# Patient Record
Sex: Female | Born: 1980 | Race: Black or African American | Hispanic: No | Marital: Married | Smoking: Never smoker
Health system: Southern US, Community
[De-identification: ages and names within clinical notes are randomized; demographics above are authoritative.]

## PROBLEM LIST (undated history)

## (undated) DIAGNOSIS — Z789 Other specified health status: Secondary | ICD-10-CM

## (undated) DIAGNOSIS — R87629 Unspecified abnormal cytological findings in specimens from vagina: Secondary | ICD-10-CM

## (undated) HISTORY — PX: WISDOM TOOTH EXTRACTION: SHX21

## (undated) HISTORY — PX: GYNECOLOGIC CRYOSURGERY: SHX857

## (undated) HISTORY — DX: Other specified health status: Z78.9

## (undated) HISTORY — DX: Unspecified abnormal cytological findings in specimens from vagina: R87.629

---

## 2011-04-14 ENCOUNTER — Other Ambulatory Visit (HOSPITAL_COMMUNITY)
Admission: RE | Admit: 2011-04-14 | Discharge: 2011-04-14 | Disposition: A | Discharge status: Home / Self Care | Payer: BC Managed Care – PPO | Source: Ambulatory Visit | Attending: Obstetrics and Gynecology | Admitting: Obstetrics and Gynecology

## 2011-04-14 DIAGNOSIS — Z113 Encounter for screening for infections with a predominantly sexual mode of transmission: Secondary | ICD-10-CM | POA: Insufficient documentation

## 2011-04-14 DIAGNOSIS — Z1159 Encounter for screening for other viral diseases: Secondary | ICD-10-CM | POA: Insufficient documentation

## 2011-04-14 DIAGNOSIS — Z124 Encounter for screening for malignant neoplasm of cervix: Secondary | ICD-10-CM | POA: Insufficient documentation

## 2013-07-23 ENCOUNTER — Other Ambulatory Visit: Payer: Self-pay | Admitting: Family Medicine

## 2013-07-23 DIAGNOSIS — E049 Nontoxic goiter, unspecified: Secondary | ICD-10-CM

## 2013-07-26 ENCOUNTER — Other Ambulatory Visit: Payer: Self-pay

## 2013-07-26 ENCOUNTER — Ambulatory Visit
Admission: RE | Admit: 2013-07-26 | Discharge: 2013-07-26 | Disposition: A | Discharge status: Home / Self Care | Payer: BC Managed Care – PPO | Source: Ambulatory Visit | Attending: Family Medicine | Admitting: Family Medicine

## 2013-07-26 ENCOUNTER — Other Ambulatory Visit: Payer: Self-pay | Admitting: Family Medicine

## 2013-07-26 DIAGNOSIS — E049 Nontoxic goiter, unspecified: Secondary | ICD-10-CM

## 2013-10-28 LAB — OB RESULTS CONSOLE HEPATITIS B SURFACE ANTIGEN: Hepatitis B Surface Ag: NEGATIVE

## 2013-10-28 LAB — OB RESULTS CONSOLE ABO/RH: RH TYPE: POSITIVE

## 2013-10-28 LAB — OB RESULTS CONSOLE RPR: RPR: NONREACTIVE

## 2013-10-28 LAB — OB RESULTS CONSOLE ANTIBODY SCREEN: Antibody Screen: NEGATIVE

## 2013-10-28 LAB — OB RESULTS CONSOLE RUBELLA ANTIBODY, IGM: Rubella: IMMUNE

## 2013-10-28 LAB — OB RESULTS CONSOLE HIV ANTIBODY (ROUTINE TESTING): HIV: NONREACTIVE

## 2013-11-21 LAB — OB RESULTS CONSOLE GC/CHLAMYDIA
Chlamydia: NEGATIVE
Gonorrhea: NEGATIVE

## 2014-03-09 ENCOUNTER — Inpatient Hospital Stay (HOSPITAL_COMMUNITY)
Admission: AD | Admit: 2014-03-09 | Payer: BC Managed Care – PPO | Source: Ambulatory Visit | Admitting: Obstetrics and Gynecology

## 2014-03-12 LAB — OB RESULTS CONSOLE RPR: RPR: NONREACTIVE

## 2014-05-16 LAB — OB RESULTS CONSOLE GBS: STREP GROUP B AG: NEGATIVE

## 2014-06-16 ENCOUNTER — Encounter (HOSPITAL_COMMUNITY): Payer: Self-pay | Admitting: *Deleted

## 2014-06-16 ENCOUNTER — Telehealth (HOSPITAL_COMMUNITY): Payer: Self-pay | Admitting: *Deleted

## 2014-06-16 NOTE — Telephone Encounter (Signed)
Preadmission screen  

## 2014-06-17 ENCOUNTER — Other Ambulatory Visit: Payer: Self-pay | Admitting: Obstetrics and Gynecology

## 2014-06-17 ENCOUNTER — Encounter (HOSPITAL_COMMUNITY): Payer: Self-pay | Admitting: Obstetrics

## 2014-06-17 ENCOUNTER — Inpatient Hospital Stay (HOSPITAL_COMMUNITY)
Admission: AD | Admit: 2014-06-17 | Discharge: 2014-06-20 | DRG: 766 | Disposition: A | Discharge status: Home / Self Care | Payer: BLUE CROSS/BLUE SHIELD | Source: Ambulatory Visit | Attending: Obstetrics and Gynecology | Admitting: Obstetrics and Gynecology

## 2014-06-17 ENCOUNTER — Inpatient Hospital Stay (HOSPITAL_COMMUNITY): Admission: RE | Admit: 2014-06-17 | Payer: BLUE CROSS/BLUE SHIELD | Source: Ambulatory Visit

## 2014-06-17 DIAGNOSIS — O328XX0 Maternal care for other malpresentation of fetus, not applicable or unspecified: Secondary | ICD-10-CM | POA: Diagnosis present

## 2014-06-17 DIAGNOSIS — O480 Post-term pregnancy: Secondary | ICD-10-CM | POA: Diagnosis present

## 2014-06-17 DIAGNOSIS — Z3A41 41 weeks gestation of pregnancy: Secondary | ICD-10-CM | POA: Diagnosis present

## 2014-06-17 LAB — CBC
HCT: 34.7 % — ABNORMAL LOW (ref 36.0–46.0)
Hemoglobin: 11.5 g/dL — ABNORMAL LOW (ref 12.0–15.0)
MCH: 23 pg — ABNORMAL LOW (ref 26.0–34.0)
MCHC: 33.1 g/dL (ref 30.0–36.0)
MCV: 69.5 fL — ABNORMAL LOW (ref 78.0–100.0)
PLATELETS: 171 10*3/uL (ref 150–400)
RBC: 4.99 MIL/uL (ref 3.87–5.11)
RDW: 16.8 % — ABNORMAL HIGH (ref 11.5–15.5)
WBC: 8.3 10*3/uL (ref 4.0–10.5)

## 2014-06-17 LAB — TYPE AND SCREEN
ABO/RH(D): A POS
Antibody Screen: NEGATIVE

## 2014-06-17 LAB — ABO/RH: ABO/RH(D): A POS

## 2014-06-17 MED ORDER — LIDOCAINE HCL (PF) 1 % IJ SOLN: 30.0000 mL | INTRAMUSCULAR | Status: DC | PRN

## 2014-06-17 MED ORDER — OXYTOCIN BOLUS FROM INFUSION: 500.0000 mL | INTRAVENOUS | Status: DC

## 2014-06-17 MED ORDER — LACTATED RINGERS IV SOLN
500.0000 mL | INTRAVENOUS | Status: DC | PRN
  Administered 2014-06-17: 500 mL via INTRAVENOUS

## 2014-06-17 MED ORDER — ZOLPIDEM TARTRATE 5 MG PO TABS: 5.0000 mg | ORAL_TABLET | Freq: Every evening | ORAL | Status: DC | PRN

## 2014-06-17 MED ORDER — MISOPROSTOL 25 MCG QUARTER TABLET
25.0000 ug | ORAL_TABLET | ORAL | Status: DC | PRN
  Administered 2014-06-17 – 2014-06-18 (×2): 25 ug via VAGINAL
  Filled 2014-06-17 (×3): qty 0.25

## 2014-06-17 MED ORDER — ACETAMINOPHEN 325 MG PO TABS: 650.0000 mg | ORAL_TABLET | ORAL | Status: DC | PRN

## 2014-06-17 MED ORDER — TERBUTALINE SULFATE 1 MG/ML IJ SOLN: 0.2500 mg | Freq: Once | INTRAMUSCULAR | Status: AC | PRN

## 2014-06-17 MED ORDER — LACTATED RINGERS IV SOLN
INTRAVENOUS | Status: DC
  Administered 2014-06-17 – 2014-06-18 (×3): via INTRAVENOUS

## 2014-06-17 MED ORDER — CITRIC ACID-SODIUM CITRATE 334-500 MG/5ML PO SOLN
30.0000 mL | ORAL | Status: DC | PRN
  Administered 2014-06-18: 30 mL via ORAL
  Filled 2014-06-17: qty 15

## 2014-06-17 MED ORDER — BUTORPHANOL TARTRATE 1 MG/ML IJ SOLN: 1.0000 mg | INTRAMUSCULAR | Status: DC | PRN

## 2014-06-17 MED ORDER — ONDANSETRON HCL 4 MG/2ML IJ SOLN
4.0000 mg | Freq: Four times a day (QID) | INTRAMUSCULAR | Status: DC | PRN
  Administered 2014-06-18: 4 mg via INTRAVENOUS
  Filled 2014-06-17: qty 2

## 2014-06-17 MED ORDER — OXYTOCIN 40 UNITS IN LACTATED RINGERS INFUSION - SIMPLE MED: 62.5000 mL/h | INTRAVENOUS | Status: DC

## 2014-06-17 MED ORDER — OXYCODONE-ACETAMINOPHEN 5-325 MG PO TABS: 1.0000 | ORAL_TABLET | ORAL | Status: DC | PRN

## 2014-06-17 MED ORDER — OXYTOCIN 40 UNITS IN LACTATED RINGERS INFUSION - SIMPLE MED: 1.0000 m[IU]/min | INTRAVENOUS | Status: DC

## 2014-06-17 MED ORDER — OXYCODONE-ACETAMINOPHEN 5-325 MG PO TABS: 2.0000 | ORAL_TABLET | ORAL | Status: DC | PRN

## 2014-06-17 NOTE — Plan of Care (Signed)
Problem: Consults Goal: Birthing Suites Patient Information Press F2 to bring up selections list Outcome: Completed/Met Date Met:  06/17/14  Inpatient induction

## 2014-06-17 NOTE — H&P (Signed)
Nancy Conley is a 34 y.o. female G1 at 68 6/7 weeks c/w 12 week ultrasound presenting for IOL due to post dates pregnancy.   No complaints today at routine Ob visit.  Pt denies vaginal bleeding, leakage of fluid or contractions.  Active fetus noted.   Pregnancy has been uncomplicated.  Pt has a h/o genital herpes, one outbreak since diagnosis.  None this pregnancy.  Pt has been taking Valtrex 500 mg BID. Maternal Medical History:  Reason for admission: Induction of labor.  Contractions: Frequency: rare.   Perceived severity is mild.    Fetal activity: Perceived fetal activity is normal.    Prenatal complications: no prenatal complications Prenatal Complications - Diabetes: none.    OB History    Gravida Para Term Preterm AB TAB SAB Ectopic Multiple Living   1              Past Medical History  Diagnosis Date  . Medical history non-contributory   . Vaginal Pap smear, abnormal    Past Surgical History  Procedure Laterality Date  . Wisdom tooth extraction    . Gynecologic cryosurgery     Family History: family history includes Arthritis in her maternal uncle; Cancer in her maternal aunt and maternal uncle; Diabetes in her father; Hypertension in her father, maternal grandmother, mother, and paternal grandmother; Kidney disease in her paternal uncle; Stroke in her maternal grandmother. There is no history of Alcohol abuse, Asthma, Birth defects, COPD, Depression, Drug abuse, Hearing loss, Early death, Hyperlipidemia, Heart disease, Mental illness, Learning disabilities, Mental retardation, Miscarriages / Stillbirths, Vision loss, or Varicose Veins. Social History:  reports that she has never smoked. She has never used smokeless tobacco. She reports that she drinks alcohol. She reports that she does not use illicit drugs.   Prenatal Transfer Tool  Maternal Diabetes: No Genetic Screening: Normal Maternal Ultrasounds/Referrals: Normal Fetal Ultrasounds or other Referrals:   None Maternal Substance Abuse:  No Significant Maternal Medications:  None Significant Maternal Lab Results:  Lab values include: Group B Strep negative Other Comments:  H/o genital herpes on Valtrex 500 mg BID  ROS    Last menstrual period 09/10/2013. Maternal Exam:  Abdomen: Patient reports no abdominal tenderness. Estimated fetal weight is 7.5-8 lbs.   Fetal presentation: vertex  Introitus: Normal vulva. Vulva is negative for condylomata and lesion.  Vagina is positive for edema.  Vagina is negative for ulcerations.  Ferning test: not done.   Pelvis: adequate for delivery.   Cervix: Cervix evaluated by sterile speculum exam and digital exam.     Fetal Exam Fetal Monitor Review: Mode: hand-held doppler probe.   Baseline rate: 150s.      Physical Exam  Constitutional: She is oriented to person, place, and time. She appears well-developed and well-nourished. No distress.  HENT:  Head: Normocephalic and atraumatic.  Neck: Normal range of motion.  GI: There is no tenderness.  Genitourinary: Vagina normal and uterus normal. Vulva exhibits no lesion.  Musculoskeletal: Normal range of motion. She exhibits no edema or tenderness.  Neurological: She is alert and oriented to person, place, and time.  Skin: Skin is warm and dry. She is not diaphoretic.  Psychiatric: She has a normal mood and affect.    Prenatal labs: ABO, Rh: A/Positive/-- (06/22 0000) Antibody: Negative (06/22 0000) Rubella:   RPR: Nonreactive (11/04 0000)  HBsAg:    HIV: Non-reactive (06/22 0000)  GBS: Negative (01/08 0000)   Assessment/Plan: IUP at 40 6/7 weeks Postdates pregnancy H/o HSV  2. H/o CKC  Admit for IOL with Cytotec.  Foley in am if cervix is dilated.   Pt counseled on risk of induction of labor.  Informed that due to an unfavorable cervix, risk of cesarean section is higher and induction may be long.  Pt desires to proceed. Continue Valtrex. Stadol prn pain in latent labor. Epidural  in active labor if pt desires. [facility] to cover b/w 7 pm and 7 am.   [name], Christopher Glasscock 06/17/2014, 5:36 PM

## 2014-06-17 NOTE — Progress Notes (Signed)
  Subjective: No c/o.  Objective: BP 124/71 mmHg  Pulse 75  Temp(Src) 98.1 F (36.7 C) (Oral)  Resp 16  Ht 5' 4.75" (1.645 m)  Wt 172 lb (78.019 kg)  BMI 28.83 kg/m2  LMP 09/10/2013     FHT: BL 150 w/ moderate variability, +accels, occ quick/mild variables  2224:2 min late decel x1 to 60 at nadir, with moderate variability after recovery UC:   irregular, every 3-8 minutes SVE: Deferred  Cytotec #1 placed at 8:57 PM   Assessment:  34 yo G1P0 @ 40.6 wks  FHR decel improved w/ IVFs and position change; overall reactive   Plan: Monitor closely Consult as indicated   [name], Laban Orourke CNM 06/17/2014, 10:42 PM

## 2014-06-18 ENCOUNTER — Encounter (HOSPITAL_COMMUNITY)
Admission: AD | Disposition: A | Discharge status: Home / Self Care | Payer: Self-pay | Source: Ambulatory Visit | Attending: Obstetrics and Gynecology

## 2014-06-18 ENCOUNTER — Encounter (HOSPITAL_COMMUNITY): Payer: Self-pay

## 2014-06-18 ENCOUNTER — Inpatient Hospital Stay (HOSPITAL_COMMUNITY): Payer: BLUE CROSS/BLUE SHIELD | Admitting: Anesthesiology

## 2014-06-18 SURGERY — Surgical Case
Anesthesia: Epidural | Site: Abdomen

## 2014-06-18 MED ORDER — SIMETHICONE 80 MG PO CHEW
80.0000 mg | CHEWABLE_TABLET | ORAL | Status: DC
  Administered 2014-06-19 (×2): 80 mg via ORAL
  Filled 2014-06-18 (×4): qty 1

## 2014-06-18 MED ORDER — KETOROLAC TROMETHAMINE 30 MG/ML IJ SOLN: 30.0000 mg | Freq: Four times a day (QID) | INTRAMUSCULAR | Status: AC | PRN

## 2014-06-18 MED ORDER — METHYLERGONOVINE MALEATE 0.2 MG/ML IJ SOLN: 0.2000 mg | INTRAMUSCULAR | Status: DC | PRN

## 2014-06-18 MED ORDER — NALBUPHINE HCL 10 MG/ML IJ SOLN: 5.0000 mg | INTRAMUSCULAR | Status: DC | PRN

## 2014-06-18 MED ORDER — NALBUPHINE HCL 10 MG/ML IJ SOLN
5.0000 mg | INTRAMUSCULAR | Status: DC | PRN
  Administered 2014-06-19: 5 mg via INTRAVENOUS

## 2014-06-18 MED ORDER — EPHEDRINE 5 MG/ML INJ: 10.0000 mg | INTRAVENOUS | Status: DC | PRN

## 2014-06-18 MED ORDER — PROMETHAZINE HCL 25 MG/ML IJ SOLN: 6.2500 mg | INTRAMUSCULAR | Status: DC | PRN

## 2014-06-18 MED ORDER — NALOXONE HCL 1 MG/ML IJ SOLN: 1.0000 ug/kg/h | INTRAVENOUS | Status: DC | PRN

## 2014-06-18 MED ORDER — SCOPOLAMINE 1 MG/3DAYS TD PT72
MEDICATED_PATCH | TRANSDERMAL | Status: DC | PRN
  Administered 2014-06-18: 1 via TRANSDERMAL

## 2014-06-18 MED ORDER — FENTANYL 2.5 MCG/ML BUPIVACAINE 1/10 % EPIDURAL INFUSION (WH - ANES)
14.0000 mL/h | INTRAMUSCULAR | Status: DC | PRN
  Administered 2014-06-18: 14 mL/h via EPIDURAL
  Filled 2014-06-18: qty 125

## 2014-06-18 MED ORDER — DIPHENHYDRAMINE HCL 50 MG/ML IJ SOLN: 12.5000 mg | INTRAMUSCULAR | Status: DC | PRN

## 2014-06-18 MED ORDER — LACTATED RINGERS IV SOLN
INTRAVENOUS | Status: DC
  Administered 2014-06-18: 16:00:00 via INTRAUTERINE

## 2014-06-18 MED ORDER — CEFAZOLIN SODIUM-DEXTROSE 2-3 GM-% IV SOLR: 2.0000 g | INTRAVENOUS | Status: DC

## 2014-06-18 MED ORDER — CEFAZOLIN SODIUM-DEXTROSE 2-3 GM-% IV SOLR
INTRAVENOUS | Status: DC | PRN
  Administered 2014-06-18: 2 g via INTRAVENOUS

## 2014-06-18 MED ORDER — MORPHINE SULFATE (PF) 0.5 MG/ML IJ SOLN
INTRAMUSCULAR | Status: DC | PRN
  Administered 2014-06-18: 1 mg via INTRAVENOUS
  Administered 2014-06-18: 4 mg via EPIDURAL

## 2014-06-18 MED ORDER — OXYCODONE-ACETAMINOPHEN 5-325 MG PO TABS: 2.0000 | ORAL_TABLET | ORAL | Status: DC | PRN

## 2014-06-18 MED ORDER — PRENATAL MULTIVITAMIN CH
1.0000 | ORAL_TABLET | Freq: Every day | ORAL | Status: DC
  Administered 2014-06-19 – 2014-06-20 (×2): 1 via ORAL
  Filled 2014-06-18 (×2): qty 1

## 2014-06-18 MED ORDER — ONDANSETRON HCL 4 MG/2ML IJ SOLN: 4.0000 mg | Freq: Three times a day (TID) | INTRAMUSCULAR | Status: DC | PRN

## 2014-06-18 MED ORDER — PHENYLEPHRINE 40 MCG/ML (10ML) SYRINGE FOR IV PUSH (FOR BLOOD PRESSURE SUPPORT)
80.0000 ug | PREFILLED_SYRINGE | INTRAVENOUS | Status: DC | PRN
  Filled 2014-06-18: qty 20

## 2014-06-18 MED ORDER — MEPERIDINE HCL 25 MG/ML IJ SOLN: 6.2500 mg | INTRAMUSCULAR | Status: DC | PRN

## 2014-06-18 MED ORDER — SIMETHICONE 80 MG PO CHEW: 80.0000 mg | CHEWABLE_TABLET | ORAL | Status: DC | PRN

## 2014-06-18 MED ORDER — LIDOCAINE HCL (PF) 1 % IJ SOLN
INTRAMUSCULAR | Status: DC | PRN
  Administered 2014-06-18 (×2): 5 mL

## 2014-06-18 MED ORDER — SENNOSIDES-DOCUSATE SODIUM 8.6-50 MG PO TABS
2.0000 | ORAL_TABLET | ORAL | Status: DC
  Administered 2014-06-19 (×2): 2 via ORAL
  Filled 2014-06-18 (×4): qty 2

## 2014-06-18 MED ORDER — CEFAZOLIN SODIUM-DEXTROSE 2-3 GM-% IV SOLR
INTRAVENOUS | Status: AC
  Filled 2014-06-18: qty 50

## 2014-06-18 MED ORDER — KETOROLAC TROMETHAMINE 30 MG/ML IJ SOLN
INTRAMUSCULAR | Status: AC
Start: 2014-06-18 — End: 2014-06-19
  Filled 2014-06-18: qty 1

## 2014-06-18 MED ORDER — DIPHENHYDRAMINE HCL 25 MG PO CAPS
25.0000 mg | ORAL_CAPSULE | Freq: Four times a day (QID) | ORAL | Status: DC | PRN
  Filled 2014-06-18: qty 1

## 2014-06-18 MED ORDER — LIDOCAINE-EPINEPHRINE (PF) 2 %-1:200000 IJ SOLN
INTRAMUSCULAR | Status: DC | PRN
  Administered 2014-06-18 (×2): 5 mL via EPIDURAL

## 2014-06-18 MED ORDER — ONDANSETRON HCL 4 MG/2ML IJ SOLN: 4.0000 mg | INTRAMUSCULAR | Status: DC | PRN

## 2014-06-18 MED ORDER — ONDANSETRON HCL 4 MG/2ML IJ SOLN
INTRAMUSCULAR | Status: AC
  Filled 2014-06-18: qty 2

## 2014-06-18 MED ORDER — MENTHOL 3 MG MT LOZG: 1.0000 | LOZENGE | OROMUCOSAL | Status: DC | PRN

## 2014-06-18 MED ORDER — DIPHENHYDRAMINE HCL 25 MG PO CAPS: 25.0000 mg | ORAL_CAPSULE | ORAL | Status: DC | PRN

## 2014-06-18 MED ORDER — 0.9 % SODIUM CHLORIDE (POUR BTL) OPTIME
TOPICAL | Status: DC | PRN
  Administered 2014-06-18: 1000 mL

## 2014-06-18 MED ORDER — METHYLERGONOVINE MALEATE 0.2 MG PO TABS: 0.2000 mg | ORAL_TABLET | ORAL | Status: DC | PRN

## 2014-06-18 MED ORDER — HYDROMORPHONE HCL 1 MG/ML IJ SOLN: 0.2500 mg | INTRAMUSCULAR | Status: DC | PRN

## 2014-06-18 MED ORDER — NALBUPHINE HCL 10 MG/ML IJ SOLN: 5.0000 mg | Freq: Once | INTRAMUSCULAR | Status: AC | PRN

## 2014-06-18 MED ORDER — SODIUM BICARBONATE 8.4 % IV SOLN
INTRAVENOUS | Status: AC
  Filled 2014-06-18: qty 50

## 2014-06-18 MED ORDER — SCOPOLAMINE 1 MG/3DAYS TD PT72
MEDICATED_PATCH | TRANSDERMAL | Status: AC
  Filled 2014-06-18: qty 1

## 2014-06-18 MED ORDER — KETOROLAC TROMETHAMINE 30 MG/ML IJ SOLN
30.0000 mg | Freq: Four times a day (QID) | INTRAMUSCULAR | Status: AC | PRN
  Administered 2014-06-18: 30 mg via INTRAVENOUS

## 2014-06-18 MED ORDER — LACTATED RINGERS IV SOLN: 500.0000 mL | Freq: Once | INTRAVENOUS | Status: DC

## 2014-06-18 MED ORDER — ONDANSETRON HCL 4 MG/2ML IJ SOLN
INTRAMUSCULAR | Status: DC | PRN
  Administered 2014-06-18: 4 mg via INTRAVENOUS

## 2014-06-18 MED ORDER — SIMETHICONE 80 MG PO CHEW
80.0000 mg | CHEWABLE_TABLET | Freq: Three times a day (TID) | ORAL | Status: DC
  Administered 2014-06-19 – 2014-06-20 (×4): 80 mg via ORAL
  Filled 2014-06-18 (×6): qty 1

## 2014-06-18 MED ORDER — SCOPOLAMINE 1 MG/3DAYS TD PT72: 1.0000 | MEDICATED_PATCH | Freq: Once | TRANSDERMAL | Status: DC

## 2014-06-18 MED ORDER — OXYTOCIN 40 UNITS IN LACTATED RINGERS INFUSION - SIMPLE MED
1.0000 m[IU]/min | INTRAVENOUS | Status: DC
  Administered 2014-06-18: 2 m[IU]/min via INTRAVENOUS
  Administered 2014-06-18: 1 m[IU]/min via INTRAVENOUS
  Filled 2014-06-18: qty 1000

## 2014-06-18 MED ORDER — LACTATED RINGERS IV SOLN
INTRAVENOUS | Status: DC
  Administered 2014-06-19: 06:00:00 via INTRAVENOUS

## 2014-06-18 MED ORDER — LIDOCAINE-EPINEPHRINE (PF) 2 %-1:200000 IJ SOLN
INTRAMUSCULAR | Status: AC
  Filled 2014-06-18: qty 20

## 2014-06-18 MED ORDER — FERROUS SULFATE 325 (65 FE) MG PO TABS
325.0000 mg | ORAL_TABLET | Freq: Two times a day (BID) | ORAL | Status: DC
  Administered 2014-06-19 – 2014-06-20 (×3): 325 mg via ORAL
  Filled 2014-06-18 (×3): qty 1

## 2014-06-18 MED ORDER — ONDANSETRON HCL 4 MG PO TABS
4.0000 mg | ORAL_TABLET | ORAL | Status: DC | PRN
Start: 2014-06-18 — End: 2014-06-20

## 2014-06-18 MED ORDER — OXYCODONE-ACETAMINOPHEN 5-325 MG PO TABS
1.0000 | ORAL_TABLET | ORAL | Status: DC | PRN
Start: 2014-06-18 — End: 2014-06-20
  Administered 2014-06-19 – 2014-06-20 (×4): 1 via ORAL
  Filled 2014-06-18 (×4): qty 1

## 2014-06-18 MED ORDER — LANOLIN HYDROUS EX OINT: 1.0000 "application " | TOPICAL_OINTMENT | CUTANEOUS | Status: DC | PRN

## 2014-06-18 MED ORDER — NALOXONE HCL 0.4 MG/ML IJ SOLN: 0.4000 mg | INTRAMUSCULAR | Status: DC | PRN

## 2014-06-18 MED ORDER — LACTATED RINGERS IV SOLN
INTRAVENOUS | Status: DC | PRN
  Administered 2014-06-18: 18:00:00 via INTRAVENOUS

## 2014-06-18 MED ORDER — PHENYLEPHRINE 40 MCG/ML (10ML) SYRINGE FOR IV PUSH (FOR BLOOD PRESSURE SUPPORT): 80.0000 ug | PREFILLED_SYRINGE | INTRAVENOUS | Status: DC | PRN

## 2014-06-18 MED ORDER — OXYTOCIN 40 UNITS IN LACTATED RINGERS INFUSION - SIMPLE MED: 62.5000 mL/h | INTRAVENOUS | Status: AC

## 2014-06-18 MED ORDER — IBUPROFEN 600 MG PO TABS
600.0000 mg | ORAL_TABLET | Freq: Four times a day (QID) | ORAL | Status: DC
  Administered 2014-06-19 – 2014-06-20 (×6): 600 mg via ORAL
  Filled 2014-06-18 (×6): qty 1

## 2014-06-18 MED ORDER — SODIUM CHLORIDE 0.9 % IJ SOLN: 3.0000 mL | INTRAMUSCULAR | Status: DC | PRN

## 2014-06-18 MED ORDER — ZOLPIDEM TARTRATE 5 MG PO TABS: 5.0000 mg | ORAL_TABLET | Freq: Every evening | ORAL | Status: DC | PRN

## 2014-06-18 MED ORDER — WITCH HAZEL-GLYCERIN EX PADS: 1.0000 "application " | MEDICATED_PAD | CUTANEOUS | Status: DC | PRN

## 2014-06-18 MED ORDER — IBUPROFEN 600 MG PO TABS: 600.0000 mg | ORAL_TABLET | Freq: Four times a day (QID) | ORAL | Status: DC | PRN

## 2014-06-18 MED ORDER — OXYTOCIN 10 UNIT/ML IJ SOLN
40.0000 [IU] | INTRAVENOUS | Status: DC | PRN
  Administered 2014-06-18: 40 [IU] via INTRAVENOUS

## 2014-06-18 MED ORDER — OXYTOCIN 10 UNIT/ML IJ SOLN
INTRAMUSCULAR | Status: AC
  Filled 2014-06-18: qty 4

## 2014-06-18 MED ORDER — MORPHINE SULFATE 0.5 MG/ML IJ SOLN
INTRAMUSCULAR | Status: AC
  Filled 2014-06-18: qty 10

## 2014-06-18 MED ORDER — DIBUCAINE 1 % RE OINT: 1.0000 "application " | TOPICAL_OINTMENT | RECTAL | Status: DC | PRN

## 2014-06-18 MED ORDER — LACTATED RINGERS IV SOLN
INTRAVENOUS | Status: DC | PRN
  Administered 2014-06-18: 19:00:00 via INTRAVENOUS

## 2014-06-18 MED ORDER — NALBUPHINE HCL 10 MG/ML IJ SOLN
5.0000 mg | Freq: Once | INTRAMUSCULAR | Status: AC | PRN
  Filled 2014-06-18: qty 1

## 2014-06-18 MED ORDER — TETANUS-DIPHTH-ACELL PERTUSSIS 5-2.5-18.5 LF-MCG/0.5 IM SUSP: 0.5000 mL | Freq: Once | INTRAMUSCULAR | Status: DC

## 2014-06-18 SURGICAL SUPPLY — 37 items
BARRIER ADHS 3X4 INTERCEED (GAUZE/BANDAGES/DRESSINGS) ×3 IMPLANT
BENZOIN TINCTURE PRP APPL 2/3 (GAUZE/BANDAGES/DRESSINGS) IMPLANT
CLAMP CORD UMBIL (MISCELLANEOUS) IMPLANT
CLOSURE WOUND 1/2 X4 (GAUZE/BANDAGES/DRESSINGS) ×1
CLOTH BEACON ORANGE TIMEOUT ST (SAFETY) ×3 IMPLANT
DRAPE SHEET LG 3/4 BI-LAMINATE (DRAPES) IMPLANT
DRSG OPSITE POSTOP 4X10 (GAUZE/BANDAGES/DRESSINGS) ×3 IMPLANT
DURAPREP 26ML APPLICATOR (WOUND CARE) ×3 IMPLANT
ELECT REM PT RETURN 9FT ADLT (ELECTROSURGICAL) ×3
ELECTRODE REM PT RTRN 9FT ADLT (ELECTROSURGICAL) ×1 IMPLANT
EXTRACTOR VACUUM BELL STYLE (SUCTIONS) IMPLANT
GLOVE BIO SURGEON STRL SZ7 (GLOVE) ×3 IMPLANT
GLOVE BIOGEL PI IND STRL 7.0 (GLOVE) ×1 IMPLANT
GLOVE BIOGEL PI INDICATOR 7.0 (GLOVE) ×2
GOWN STRL REUS W/TWL LRG LVL3 (GOWN DISPOSABLE) ×6 IMPLANT
KIT ABG SYR 3ML LUER SLIP (SYRINGE) IMPLANT
LIQUID BAND (GAUZE/BANDAGES/DRESSINGS) IMPLANT
NEEDLE HYPO 25X5/8 SAFETYGLIDE (NEEDLE) IMPLANT
NS IRRIG 1000ML POUR BTL (IV SOLUTION) ×3 IMPLANT
PACK C SECTION WH (CUSTOM PROCEDURE TRAY) ×3 IMPLANT
PAD OB MATERNITY 4.3X12.25 (PERSONAL CARE ITEMS) ×3 IMPLANT
RTRCTR C-SECT PINK 25CM LRG (MISCELLANEOUS) ×3 IMPLANT
STRIP CLOSURE SKIN 1/2X4 (GAUZE/BANDAGES/DRESSINGS) ×2 IMPLANT
SUT CHROMIC 0 CT 1 (SUTURE) ×9 IMPLANT
SUT CHROMIC 0 CTX 36 (SUTURE) IMPLANT
SUT PLAIN 2 0 (SUTURE)
SUT PLAIN 2 0 XLH (SUTURE) ×6 IMPLANT
SUT PLAIN ABS 2-0 54XMFL TIE (SUTURE) IMPLANT
SUT VIC AB 0 CT1 27 (SUTURE) ×4
SUT VIC AB 0 CT1 27XBRD ANBCTR (SUTURE) ×2 IMPLANT
SUT VIC AB 0 CTX 36 (SUTURE) ×6
SUT VIC AB 0 CTX36XBRD ANBCTRL (SUTURE) ×3 IMPLANT
SUT VIC AB 2-0 CT1 27 (SUTURE) ×2
SUT VIC AB 2-0 CT1 TAPERPNT 27 (SUTURE) ×1 IMPLANT
SUT VIC AB 4-0 KS 27 (SUTURE) ×3 IMPLANT
TOWEL OR 17X24 6PK STRL BLUE (TOWEL DISPOSABLE) ×3 IMPLANT
TRAY FOLEY CATH 14FR (SET/KITS/TRAYS/PACK) IMPLANT

## 2014-06-18 NOTE — Progress Notes (Signed)
Pt with variable decelerations, tachysystole on 2 mUs Pitocin.  Pitocin discontinued.  Pt also with severe pain.  Now comfortable s/p epidural.  VSS Gen:  NAD, pt comfortable. Cervix with scar at os, bulging cervix/bag. SSE:  Cervical mucus at os. Purple ecchymosis noted around os, bulging cervix noted. Lacrimal dilator used to open os.  Then 23 Pratt used to open os further.  ROM noted, clear fluid.  Minimal-moderate blood.  No active bleeding. SVE: Cervix 2 cm then opened easily to 3 cm/80/-3.  Still feels OP. IUPC placed without difficulty.  EFM:  Reactive occasional variable decel.  Contractions irregular, spaced out.  A:  IUP @ 41 0/7 weeks. Post dates induction. Scarring of cervix s/p CKC.  S/p manual cervical dilitation. Monitor fetal tracing closely. Resume Pitocin if contractions remain irregular s/p ROM.  Restart at 1 mU. Amnioinfusion prn repeatative variables. Plan d/w pt.

## 2014-06-18 NOTE — Progress Notes (Signed)
Pt with prolonged variable decelerations on 3 milliunits of Pitocin.  Currently tracing reassuring.  Contractions every 4 minutes. Amnioinfusion in progress but mild variables noted with contractions. Cervix 3/80/-2, no bloody show.  IUP at 41 weeks. Fetal intolerance to labor. Recommend proceeding with cesarean section.  Amnioinfusion will not likely remedy variable decels in that she still has mild decelerations with Pitocin off. R/B/A reviewed with pt and husband.  Pt desires to proceed.  Consent signed.

## 2014-06-18 NOTE — Brief Op Note (Signed)
06/17/2014 - 06/18/2014  8:19 PM  PATIENT:  Nancy Conley  34 y.o. female  PRE-OPERATIVE DIAGNOSIS:  IUP at 41 0/7 weeks,  Fetal interolance to labor  POST-OPERATIVE DIAGNOSIS:  Same, Nuchal cord x 2, ROP presentation  PROCEDURE:  Procedure(s): CESAREAN SECTION (N/A), Primary LTCS, 2 layer closure  SURGEON:  Surgeon(s) and Role:    * Geryl RankinsEvelyn Guerino Caporale, MD - Primary  PHYSICIAN ASSISTANT:   ASSISTANTS: Technician   ANESTHESIA:   epidural  EBL:  Total I/O In: 1200 [I.V.:1200] Out: 200 [Urine:200] EBL: 700 cc  BLOOD ADMINISTERED:none  DRAINS: Urinary Catheter (Foley)   LOCAL MEDICATIONS USED:  NONE  SPECIMEN:  Source of Specimen:  Placenta  DISPOSITION OF SPECIMEN:  PATHOLOGY  COUNTS:  YES  TOURNIQUET:  * No tourniquets in log *  DICTATION: .Other Dictation: Dictation Number [id number]  PLAN OF CARE: Transfer to postpartum after PACU  PATIENT DISPOSITION:  PACU - hemodynamically stable.   Delay start of Pharmacological VTE agent (>24hrs) due to surgical blood loss or risk of bleeding: yes

## 2014-06-18 NOTE — Progress Notes (Signed)
Dr Dion Bodyvarnado at bedside discussing with pt risks and benefits of primary c section, pt verbalizes and understands, to proceed with primary c section.

## 2014-06-18 NOTE — Anesthesia Preprocedure Evaluation (Signed)

## 2014-06-18 NOTE — Progress Notes (Signed)
Spoke with dr Dion Bodyvarnado, informed of fhr tracing, nursing interventions, will come in to see pt when finished with office.

## 2014-06-18 NOTE — Transfer of Care (Signed)
Immediate Anesthesia Transfer of Care Note  Patient: Nancy Conley  Procedure(s) Performed: Procedure(s): CESAREAN SECTION (N/A)  Patient Location: PACU  Anesthesia Type:Epidural  Level of Consciousness: awake, alert  and oriented  Airway & Oxygen Therapy: Patient Spontanous Breathing  Post-op Assessment: Report given to RN and Post -op Vital signs reviewed and stable  Post vital signs: Reviewed and stable  Last Vitals:  Filed Vitals:   06/18/14 1801  BP: 134/79  Pulse: 81  Temp:   Resp: 18    Complications: No apparent anesthesia complications

## 2014-06-18 NOTE — Progress Notes (Addendum)
  Subjective: Denies feeling ctxs.   Objective: BP 129/83 mmHg  Pulse 68  Temp(Src) 98.3 F (36.8 C) (Oral)  Resp 16  Ht 5' 4.75" (1.645 m)  Wt 172 lb (78.019 kg)  BMI 28.83 kg/m2  LMP 09/10/2013      Today's Vitals   06/18/14 0300 06/18/14 0330 06/18/14 0337 06/18/14 0359  BP:   129/83   Pulse:   68   Temp:   98.3 F (36.8 C)   TempSrc:   Oral   Resp:   16   Height:      Weight:      PainSc: Asleep 0-No pain  0-No pain    FHT: BL 140 w/ min-mod variability, +accels, intermittent mild variables 0215:Late decel x 1 minute down to 60 at nadir 0331:Late decel x 40 secs down to 100 at nadir UC:   irregular, some coupling noted SVE:   Dilation: Closed Effacement (%): Thick Station: -2 Exam by:: h stone rnc  Cytotec #2 placed at 0119  Assessment:  34 yo G1P0 @ 41.0 wks IOL for late term pregnancy FHR decels; overall reactive  Plan: 500 ml bolus Position change Discontinue Cytotec at this time Terb prn Consult as needed  Nancy Conley, Nancy Conley CNM 06/18/2014, 4:02 AM

## 2014-06-18 NOTE — Addendum Note (Signed)
Addendum  created 06/18/14 2057 by Leilani AbleFranklin Maleek Craver, MD   Modules edited: Orders, PRL Based Order Sets

## 2014-06-18 NOTE — Progress Notes (Signed)
Nancy Conley is a 34 y.o. G1P0 at 7170w0d   Subjective: Pt feeling contractions.  Denies vaginal bleeding or LOF  Objective: BP 125/56 mmHg  Pulse 78  Temp(Src) 98.2 F (36.8 C) (Oral)  Resp 18  Ht 5' 4.75" (1.645 m)  Wt 78.019 kg (172 lb)  BMI 28.83 kg/m2  LMP 09/10/2013      FHT:  Reactive, +Ltv, variable decelerations noted overnight. UC:   irregular SVE:   Dilation: Closed Effacement (%): Thick Station: -2 Exam by:: Dr Nancy Conley  Labs: Lab Results  Component Value Date   WBC 8.3 06/17/2014   HGB 11.5* 06/17/2014   HCT 34.7* 06/17/2014   MCV 69.5* 06/17/2014   PLT 171 06/17/2014    Assessment / Plan: IOL due to postdates. Cervix with scarring, unable to insert foley bulb.  Start Pitocin. Fetal decelerations overnight, reassuring tracing currently.  Labor: S/p Cytotec x 2. Preeclampsia:  no signs or symptoms of toxicity Fetal Wellbeing:  Category II Pain Control:  Stadol prn until active labor. I/D:  n/a Anticipated MOD:  NSVD  Nancy Conley 06/18/2014, 8:50 AM

## 2014-06-18 NOTE — Anesthesia Postprocedure Evaluation (Signed)
Anesthesia Post Note  Patient: Nancy Conley  Procedure(s) Performed: Procedure(s) (LRB): CESAREAN SECTION (N/A)  Anesthesia type: Epidural  Patient location: PACU  Post pain: Pain level controlled  Post assessment: Post-op Vital signs reviewed  Last Vitals:  Filed Vitals:   06/18/14 2015  BP: 142/99  Pulse: 88  Temp:   Resp: 22    Post vital signs: Reviewed  Level of consciousness: awake  Complications: No apparent anesthesia complications

## 2014-06-18 NOTE — Anesthesia Procedure Notes (Signed)
Epidural Patient location during procedure: OB Start time: 06/18/2014 11:59 AM  Staffing Anesthesiologist: Brayton CavesJACKSON, Sujata Maines Performed by: anesthesiologist   Preanesthetic Checklist Completed: patient identified, site marked, surgical consent, pre-op evaluation, timeout performed, IV checked, risks and benefits discussed and monitors and equipment checked  Epidural Patient position: sitting Prep: site prepped and draped and DuraPrep Patient monitoring: continuous pulse ox and blood pressure Approach: midline Location: L3-L4 Injection technique: LOR air  Needle:  Needle type: Tuohy  Needle gauge: 17 G Needle length: 9 cm and 9 Needle insertion depth: 5 cm cm Catheter type: closed end flexible Catheter size: 19 Gauge Catheter at skin depth: 10 cm Test dose: negative  Assessment Events: blood not aspirated, injection not painful, no injection resistance, negative IV test and no paresthesia  Additional Notes Patient identified.  Risk benefits discussed including failed block, incomplete pain control, headache, nerve damage, paralysis, blood pressure changes, nausea, vomiting, reactions to medication both toxic or allergic, and postpartum back pain.  Patient expressed understanding and wished to proceed.  All questions were answered.  Sterile technique used throughout procedure and epidural site dressed with sterile barrier dressing. No paresthesia or other complications noted.The patient did not experience any signs of intravascular injection such as tinnitus or metallic taste in mouth nor signs of intrathecal spread such as rapid motor block. Please see nursing notes for vital signs.

## 2014-06-18 NOTE — Lactation Note (Signed)
This note was copied from the chart of [facility] Patient. Lactation Consultation Note  Patient Name: Nancy Conley Barret ZOXWR'UToday's Date: 06/18/2014 Reason for consult: Initial assessment;Difficult latch (flat nipples) This is mom's first baby and her nurse from PACU had reported that baby seemed eager to latch but was unable due to mom's flat nipples.  Mom is in pp room now and PGM is holding baby.  FOB also present.  RN, Tresa EndoKelly had given mom a hand pump and #24 NS to try latching but did not use.  LC demonstrated assembly and use of hand pump, mom pre-pumped for a few minutes and then LC demonstrated hand expression but only slight glistening on nipple tip noted.  Niplpe everted very slightly with pump and a pink area in center of nipple is seen, then #24 NS applied and baby able to latch well but never started sucking. She sustained latch for several minutes and a few white drops seen in NS when she slipped off.  Cuing not noted, so LC reviewed plan: frequent STS, pre-pump and/or try latching with NS, breastfeed on cue. Mom encouraged to feed baby 8-12 times/24 hours and with feeding cues. LC encouraged review of Baby and Me pp 9, 14 and 20-25 for STS and BF information. LC provided [facility] Resource brochure and reviewed [facility] services and list of community and web site resources.     Maternal Data Formula Feeding for Exclusion: No Has patient been taught Hand Expression?: Yes (LC demonstrated) Does the patient have breastfeeding experience prior to this delivery?: No  Feeding Feeding Type: Breast Fed Length of feed:  (sustained latch but no suck)  LATCH Score/Interventions Latch: Too sleepy or reluctant, no latch achieved, no sucking elicited. Intervention(s): Skin to skin;Teach feeding cues;Waking techniques  Audible Swallowing: None Intervention(s): Skin to skin;Hand expression  Type of Nipple: Flat Intervention(s): Hand pump  Comfort (Breast/Nipple): Soft / non-tender     Hold  (Positioning): Assistance needed to correctly position infant at breast and maintain latch. Intervention(s): Breastfeeding basics reviewed;Support Pillows;Position options;Skin to skin  LATCH Score: 4 (LC assisted and observed, using #24 NS but no sustained latch and suck noted at this time)  Lactation Tools Discussed/Used Tools: Nipple Shields Nipple shield size: 24 Pump Review: Setup, frequency, and cleaning Initiated by:: LC Date initiated:: 06/18/14 Hand pump STS, cue feedings, hand expression and nipple care Signs of proper latch  Consult Status Consult Status: Follow-up Date: 06/19/14 Follow-up type: In-patient    Warrick ParisianBryant, Mateen Franssen [facility] 06/18/2014, 10:54 PM

## 2014-06-19 ENCOUNTER — Encounter (HOSPITAL_COMMUNITY): Payer: Self-pay | Admitting: Obstetrics and Gynecology

## 2014-06-19 LAB — CBC
HCT: 30 % — ABNORMAL LOW (ref 36.0–46.0)
HEMOGLOBIN: 10.1 g/dL — AB (ref 12.0–15.0)
MCH: 23.3 pg — ABNORMAL LOW (ref 26.0–34.0)
MCHC: 33.7 g/dL (ref 30.0–36.0)
MCV: 69.3 fL — ABNORMAL LOW (ref 78.0–100.0)
Platelets: 144 10*3/uL — ABNORMAL LOW (ref 150–400)
RBC: 4.33 MIL/uL (ref 3.87–5.11)
RDW: 16.3 % — ABNORMAL HIGH (ref 11.5–15.5)
WBC: 10.6 10*3/uL — AB (ref 4.0–10.5)

## 2014-06-19 LAB — RPR: RPR: NONREACTIVE

## 2014-06-19 NOTE — Addendum Note (Signed)
Addendum  created 06/19/14 0748 by Jhonnie GarnerBeth M Argie Applegate, CRNA   Modules edited: Notes Section   Notes Section:  File: [id number]

## 2014-06-19 NOTE — Anesthesia Postprocedure Evaluation (Signed)
Anesthesia Post Note  Patient: Alessandra BevelsJennifer M Schnieders  Procedure(s) Performed: Procedure(s) (LRB): CESAREAN SECTION (N/A)  Anesthesia type: Epidural  Patient location: Mother/Baby  Post pain: Pain level controlled  Post assessment: Post-op Vital signs reviewed  Last Vitals:  Filed Vitals:   06/19/14 0606  BP: 119/68  Pulse:   Temp:   Resp:     Post vital signs: Reviewed  Level of consciousness: awake  Complications: No apparent anesthesia complications

## 2014-06-19 NOTE — Op Note (Signed)
NAMEAMARIAH, KIERSTEAD           ACCOUNT NO.:  [id number]  MEDICAL RECORD NO.:  [id number]  LOCATION:  9146                          FACILITY:  [facility]  PHYSICIAN:  Pieter Partridge, MD   DATE OF BIRTH:  11-Nov-1980  DATE OF PROCEDURE:  06/18/2014 DATE OF DISCHARGE:                              OPERATIVE REPORT   PREOPERATIVE DIAGNOSES:  Intrauterine pregnancy at 41-0/7th weeks. Fetal intolerance to labor.  POSTOPERATIVE DIAGNOSES:  Intrauterine pregnancy at 41-0/7th weeks. Fetal intolerance to labor.  Nuchal cord x2 and ROP presentation.  PROCEDURE:  Primary low-transverse cesarean section with 2-layer closure.  SURGEON:  Pieter Partridge, MD  ASSISTANT:  Technician.  ANESTHESIA:  Epidural.  EBL:  700 mL.  BLOOD ADMINISTERED:  None.  DRAINS:  Foley catheter.  Clear urine at the end of the procedure. Local is none.  SPECIMEN:  Placenta to Pathology.  DISPOSITION:  To PACU, hemodynamically stable.  COMPLICATIONS:  None.  FINDINGS:  Viable female infant, Apgars 8 and 9.  Nuchal cord x2. Moderate meconium noted.  ROP, almost transverse presentation.  Not a lot of molding.  INDICATIONS:  Ms. Nesser is a 34 year old gravida 1, who was admitted the night prior to surgery for postdates induction.  She was on Cytotec and was noted to have some fetal decelerations.  Cytotec was held and in the morning, Pitocin was started.  The patient had occasional decelerations when she got up to 2 variable decelerations that were prolonged, Pitocin was stopped.  The patient has a history of cold knife cone and cervix appeared to be very scarred at the os, so, she was mechanically dilated and once the scar tissue was broken up the cervix dilated to 3 cm, Pitocin was resumed and at 3 milliunits, the patient had prolonged variable decelerations that recovered when Pitocin was stopped.  Amnioinfusion was begun prior to that, and after the Pitocin was stopped, the patient still contracted  irregularly and have few mild variable decelerations.  At that time, the patient was counseled on fetal intolerance to labor and it was recommended to proceed with the primary low-transverse cesarean section versus attempting Pitocin again and possibly having an emergent C-section.  Risks, benefits, and alternatives were reviewed.  Consent was obtained.  DESCRIPTION OF PROCEDURE:  The [facility]  Epidural anesthesia was then increased to the level of C- section, which was confirmed by Anesthesia.  She was then prepped and draped in normal fashion after a time-out was taken.  The incision was marked with 2 cm above the symphysis pubis.  A scalpel was then used to make a transverse incision on the abdomen and carried down to the underlying layer of the fascia with the Bovie.  Hemostasis was used in the subcuticular space with hemostat and Bovie as needed. Once the fascia was incised at the midline with the Bovie, the incision was extended laterally.  The rectus muscles were dissected sharply off the fascia above and below.  The rectus muscles were then separated at the midline, which were rather taut.  The peritoneum was identified and had a clear space with visualization of the Mayo tips.  The peritoneum was then stretched.  The Alexis retractor was then inserted.  Bladder flap was developed, incising the serosa with Metzenbaum scissors.  The transverse incision was then made on the lower uterine segment with a scalpel and extended with the bandage scissors.  The occiput was then brought to the incision easily and was delivered atraumatically.  Nose and mouth were suctioned. Nuchal cord x2 noted.  The cord was kind of sand and pale in appearance. The baby was chewing on my finger as the nose and mouth were suctioned. She did not have a spontaneous cry until she was cleaned off and transferred from the surgical field to the awaiting NICU team.  The  ring forceps were used to grasp the apex of the hysterotomy incision.  Cord blood was obtained.  Placenta was then delivered intact. The uterus was cleared of all clots and debris, then trailing membranes with a moist laparotomy sponge.  The lower uterine segment was then tagged with a ring forceps.  A 0 Vicryl was used for the first layer in a continuous locking stitch. Second layer of the same suture was used for imbrication.  The 0 chromic and 0 Vicryl were used for bleeding on the left hand portion of the incision.  A moist laparotomy sponge was placed in the right side to displace small bowel prior to delivery of the baby.  A laparotomy sponge was placed on the omentum while closure as well.  Once hemostasis was confirmed, there was some concern with the initial entry of the peritoneum because there was a yellow fluid that was noted, but the bladder was inspected.  The entry into the peritoneum was very high and I was able to see my tips as I opened.  The urine was clear at the end of the procedure, and the bladder was thoroughly inspected and actually no injury was noted.  The gutters were cleared, copiously irrigated with saline.  The Interceed was then placed at the hysterotomy incision and attempted to close the peritoneum, but it was far back on the right-hand side.  So, I started closuring, then I was actually reapproximating the muscle and then aborted that and then one interrupted suture at the base to displace the bladder.  The muscles and fascia were inspected for bleeding and hemostasis with cautery was used as needed.  The fascia was then reapproximated with 0 Vicryl in a continuous fashion.  Subcutaneous space was copiously irrigated and cauterized as needed, and closed with 2-0 plain gut in a continuous fashion.  Skin was then reapproximated with 4-0 Vicryl on a Keith needle.  The patient was oozing at the edges and Bovie was used, but appeared to be little oozy.   Pressure was held at the end of the closure.  Pressure dressing was applied.  The patient received Ancef 2 g IV prior to the procedure.  The baby stayed in the room and remained in good condition.  The patient was taken to the recovery room in stable condition.     Pieter PartridgeEvelyn B Elvina Bosch, MD     EBV/MEDQ  D:  06/18/2014  T:  06/19/2014  Job:  [id number]

## 2014-06-19 NOTE — Progress Notes (Signed)
Subjective: Postop Day 1: Cesarean Delivery No complaints.  Pain controlled.  Lochia normal.  Breast feeding yes. Voided after foley removed.  Objective: Temp:  [98.2 F (36.8 C)-99.4 F (37.4 C)] 98.2 F (36.8 C) (02/11 0900) Pulse Rate:  [57-88] 84 (02/11 0900) Resp:  [18-32] 18 (02/11 0900) BP: (105-142)/(52-99) 105/54 mmHg (02/11 0900) SpO2:  [95 %-100 %] 97 % (02/11 0900)  Physical Exam: Gen: NAD Lochia: Not visualized Uterine Fundus: firm, appropriately tender Incision: Dressing clean and dryll DVT Evaluation:SCDs in place    Recent Labs  06/17/14 2000 06/19/14 0545  HGB 11.5* 10.1*  HCT 34.7* 30.0*    Assessment/Plan: Status post C-section-doing well postoperatively. Routine post op care Encouraged ambulation in hall TID. Anticipate discharge POD #3.Marland Kitchen.    Nancy Conley, Nancy Conley 06/19/2014, 1:11 PM

## 2014-06-19 NOTE — Lactation Note (Signed)
This note was copied from the chart of [facility]. Lactation Consultation Note  Patient Name: Girl Marliss CzarJennifer Umana ZOXWR'UToday's Date: 06/19/2014 Reason for consult: Follow-up assessment of this mom and baby at 26 hours pp.  Comfort gelpads given to mom for tender nipples; baby is latching using NS at each feeding and mom is able to latch independently.  Most recent LATCH score=8 per RN assessment.  Baby's output exceeds minimum for this hour of life.  LC encouraged continued cue feedings and ebm on nipples prior to comfort gelpads.   Maternal Data    Feeding Feeding Type: Breast Fed Length of feed: 10 min  LATCH Score/Interventions          Comfort (Breast/Nipple): Filling, red/small blisters or bruises, mild/mod discomfort  Problem noted: Mild/Moderate discomfort (tender nipples with stretching prior to latch) Interventions (Mild/moderate discomfort): Comfort gels;Hand expression        Lactation Tools Discussed/Used   Cue and cluster feedings Comfort gelpads and ebm on nipples  Consult Status Consult Status: Follow-up Date: 06/20/14 Follow-up type: In-patient    Warrick ParisianBryant, Edris Schneck [facility] 06/19/2014, 8:54 PM

## 2014-06-20 MED ORDER — DOCUSATE SODIUM 100 MG PO CAPS: 100.0000 mg | ORAL_CAPSULE | Freq: Two times a day (BID) | ORAL | Status: DC

## 2014-06-20 MED ORDER — OXYCODONE-ACETAMINOPHEN 5-325 MG PO TABS: 1.0000 | ORAL_TABLET | Freq: Four times a day (QID) | ORAL | Status: DC | PRN

## 2014-06-20 MED ORDER — IBUPROFEN 600 MG PO TABS: 600.0000 mg | ORAL_TABLET | Freq: Four times a day (QID) | ORAL | Status: DC

## 2014-06-20 NOTE — Lactation Note (Signed)
This note was copied from the [facility] Lactation Consultation Note  Patient Name: Girl Marliss CzarJennifer Hollopeter ZOXWR'UToday's Date: 06/20/2014 Reason for consult: Follow-up assessment;Other (Comment) (using a NS )  Baby is 41 hours old and per mom last fed at 1000 am for 45 mins. Per mom reported swallows with latch and milk in the nipple shield. Per mom has a DEBP Medela at home . LC recommended due to using a Nipple shield , extra pumping ( post) for 10 -15 mins both breast  4-6 feedings a day . At least 4. Save milk and instill in the top of the NS for appetizer. Discussed nutritive vs non - nutritive feedings and the importance of the baby being nutritive when feeding. Mom already has comfort gels . LC discussed sore nipple and engorgement prevention and tx if needed. Referring to the Baby and me booklet. Pages 24 -25 . [facility].  Mother informed of post-discharge support and given phone number to the lactation department, including services for phone call assistance; out-patient appointments; and breastfeeding support group. List of other breastfeeding resources in the community given in the handout. Encouraged mother to call for problems or concerns related to breastfeeding.    Maternal Data    Feeding Feeding Type:  (per mom last fed at 1000am for 45 mins ) Length of feed: 45 min (per mom with a nipple shield )  LATCH Score/Interventions                Intervention(s): Breastfeeding basics reviewed     Lactation Tools Discussed/Used     Consult Status Consult Status: Follow-up ([facility]    Kathrin Greathouseorio, Tyrique Sporn Ann 06/20/2014, 11:59 AM

## 2014-06-20 NOTE — Discharge Instructions (Signed)
Cesarean Delivery, Care After Refer to this sheet in the next few weeks. These instructions provide you with information on caring for yourself after your procedure. Your health care provider may also give you specific instructions. Your treatment has been planned according to current medical practices, but problems sometimes occur. Call your health care provider if you have any problems or questions after you go home. HOME CARE INSTRUCTIONS  Only take over-the-counter or prescription medications as directed by your health care provider.  Percocet may cause constipation, continue Colace while on this medication.  Do not drink alcohol, especially if you are breastfeeding or taking medication to relieve pain.  Do not chew or smoke tobacco.  Continue to use good perineal care. Good perineal care includes:  Wiping your perineum from front to back.  Keeping your perineum clean.  Check your surgical cut (incision) daily for increased redness, drainage, swelling, or separation of skin.  Clean your incision gently with soap and water every day, and then pat it dry. If your health care provider says it is okay, leave the incision uncovered. Use a bandage (dressing) if the incision is draining fluid or appears irritated. If the adhesive strips across the incision do not fall off within 7 days, carefully peel them off.  Hug a pillow when coughing or sneezing until your incision is healed. This helps to relieve pain.  Do not use tampons or douche until your health care provider says it is okay.  Shower, wash your hair, and take tub baths as directed by your health care provider.  Wear a well-fitting bra that provides breast support.  Limit wearing support panties or control-top hose.  Drink enough fluids to keep your urine clear or pale yellow.  Eat high-fiber foods such as whole grain cereals and breads, brown rice, beans, and fresh fruits and vegetables every day. These foods may help prevent  or relieve constipation.  Resume activities such as climbing stairs, driving, lifting, exercising, or traveling as directed by your health care provider.  Talk to your health care provider about resuming sexual activities. This is dependent upon your risk of infection, your rate of healing, and your comfort and desire to resume sexual activity.  Try to have someone help you with your household activities and your newborn for at least a few days after you leave the hospital.  Rest as much as possible. Try to rest or take a nap when your newborn is sleeping.  Increase your activities gradually.  Keep all of your scheduled postpartum appointments. It is very important to keep your scheduled follow-up appointments. At these appointments, your health care provider will be checking to make sure that you are healing physically and emotionally. SEEK MEDICAL CARE IF:   You are passing large clots from your vagina. Save any clots to show your health care provider.  You have a foul smelling discharge from your vagina.  You have trouble urinating.  You are urinating frequently.  You have pain when you urinate.  You have a change in your bowel movements.  You have increasing redness, pain, or swelling near your incision.  You have pus draining from your incision.  Your incision is separating.  You have painful, hard, or reddened breasts.  You have a severe headache.  You have blurred vision or see spots.  You feel sad or depressed.  You have thoughts of hurting yourself or your newborn.  You have questions about your care, the care of your newborn, or medications.  You  are dizzy or light-headed.  You have a rash.  You have pain, redness, or swelling at the site of the removed intravenous access (IV) tube.  You have nausea or vomiting.  You stopped breastfeeding and have not had a menstrual period within 12 weeks of stopping.  You are not breastfeeding and have not had a  menstrual period within 12 weeks of delivery.  You have a fever. SEEK IMMEDIATE MEDICAL CARE IF:  You have persistent pain.  You have chest pain.  You have shortness of breath.  You faint.  You have leg pain.  You have stomach pain.  Your vaginal bleeding saturates 2 or more sanitary pads in 1 hour. MAKE SURE YOU:   Understand these instructions.  Will watch your condition.  Will get help right away if you are not doing well or get worse. Document Released: 01/15/2002 Document Revised: 09/09/2013 Document Reviewed: 12/21/2011 [facility] Patient Information 2015 [employer] [place]. This information is not intended to replace advice given to you by your health care provider. Make sure you discuss any questions you have with your health care provider.

## 2014-06-20 NOTE — Progress Notes (Signed)
Subjective: Postop Day 2: Cesarean Delivery Patient resting comfortably, report no acute complaints.  Pain controlled.  Lochia appropriate.  Breast feeding yes. Tolerating general diet.  +flatus, no BM.  Voiding and ambulating freely.  No F/C/CP/SOB.  Objective: Temp:  [98.2 F (36.8 C)-98.8 F (37.1 C)] 98.6 F (37 C) (02/11 1745) Pulse Rate:  [77-84] 82 (02/11 1745) Resp:  [18-21] 18 (02/11 1745) BP: (105-124)/(54-75) 124/75 mmHg (02/11 1745) SpO2:  [97 %-98 %] 98 % (02/11 1745)  Physical Exam: Gen: NAD CV: RRR Lungs: CTAB Abd: soft, non-tender, +BS Lochia: Not visualized Uterine Fundus: firm, @ umbilicus, appropriately tender Incision: honeycomb dressing in place, clean and dry DVT Evaluation:1+ pedal edema bilaterally, Homan's negative bilaterally    Recent Labs  06/17/14 2000 06/19/14 0545  HGB 11.5* 10.1*  HCT 34.7* 30.0*    Assessment/Plan: Status post C-section-doing well postoperatively. Routine post op care Encouraged ambulation in hall TID. Anticipate discharge today on POD#2   Myna HidalgoOZAN, Arly, M 06/20/2014, 7:04 AM

## 2014-06-20 NOTE — Discharge Summary (Signed)
Obstetric Discharge Summary Reason for Admission: induction of labor Prenatal Procedures: none Intrapartum Procedures: primary LTCS Postpartum Procedures: none Complications-Operative and Postpartum: none HEMOGLOBIN  Date Value Ref Range Status  06/19/2014 10.1* 12.0 - 15.0 g/dL Final   HCT  Date Value Ref Range Status  06/19/2014 30.0* 36.0 - 46.0 % Final   Hospital Course: Ms. Nancy Conley is a 33yo G1P0@ 6838w0d who was admitted for postdate induction of labor.  Induction was started with Cytotec and continued with Pitocin.  There was some difficulty with cervical dilation due to scar tissue from her prior cold knife cone biopsy that was mechanically dilated.  The patient's maximum dilation was 3cm due to fetal intolerance of labor.  A primary C-section was performed by Dr. Dion BodyVarnado, please see the operative report for further information.  Her postoperative course was uncomplicated and she was discharged home in stable condition on POD #2.  Physical Exam:  Gen: NAD CV: RRRLungs: CTAB Abd: soft, non-tender, +BS Lochia: Not visualized Uterine Fundus: firm, @ umbilicus, appropriately tender Incision: honeycomb dressing in place, clean and dry DVT Evaluation:1+ pedal edema bilaterally, Homan's negative bilaterally  Discharge Diagnoses: Term Pregnancy-delivered  Discharge Information: Date: 06/20/2014 Activity: pelvic rest Diet: routine Medications: PNV, Ibuprofen, Colace and Percocet Condition: stable Instructions: refer to practice specific booklet Discharge to: home Follow-up Information    Follow up with Nancy Conley, [name], MD In 2 weeks.   Specialty:  Obstetrics and Gynecology   Contact information:   [name] [place] [address] [phone]       Newborn Data: Live born female  Birth Weight: 7 lb 10.2 oz (3465 g) APGAR: 8, 9  Home with mother.  Nancy Conley, Cova, M 06/20/2014, 9:06 PM

## 2014-07-29 ENCOUNTER — Other Ambulatory Visit: Payer: Self-pay | Admitting: Obstetrics and Gynecology

## 2014-07-29 ENCOUNTER — Other Ambulatory Visit (HOSPITAL_COMMUNITY)
Admission: RE | Admit: 2014-07-29 | Discharge: 2014-07-29 | Disposition: A | Discharge status: Home / Self Care | Payer: BLUE CROSS/BLUE SHIELD | Source: Ambulatory Visit | Attending: Obstetrics and Gynecology | Admitting: Obstetrics and Gynecology

## 2014-07-29 DIAGNOSIS — Z01419 Encounter for gynecological examination (general) (routine) without abnormal findings: Secondary | ICD-10-CM | POA: Insufficient documentation

## 2014-07-29 DIAGNOSIS — Z1151 Encounter for screening for human papillomavirus (HPV): Secondary | ICD-10-CM | POA: Insufficient documentation

## 2014-07-31 LAB — CYTOLOGY - PAP

## 2015-02-16 IMAGING — US US SOFT TISSUE HEAD/NECK
1 series · 14 of 25 positions shown · non-contrast
Comparison: None.

CLINICAL DATA: Thyroid goiter on physical examination.

EXAM:
THYROID ULTRASOUND
TECHNIQUE: Ultrasound examination of the thyroid gland and adjacent soft
tissues was performed.

[Series 1: us soft tissue head/neck · 0.07mm/px · 14 of 51 slices shown]
[im 1/51]
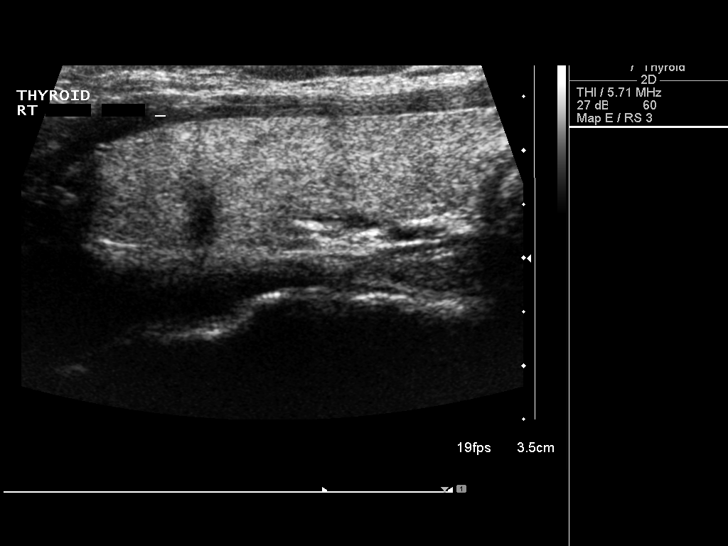
[im 5/51]
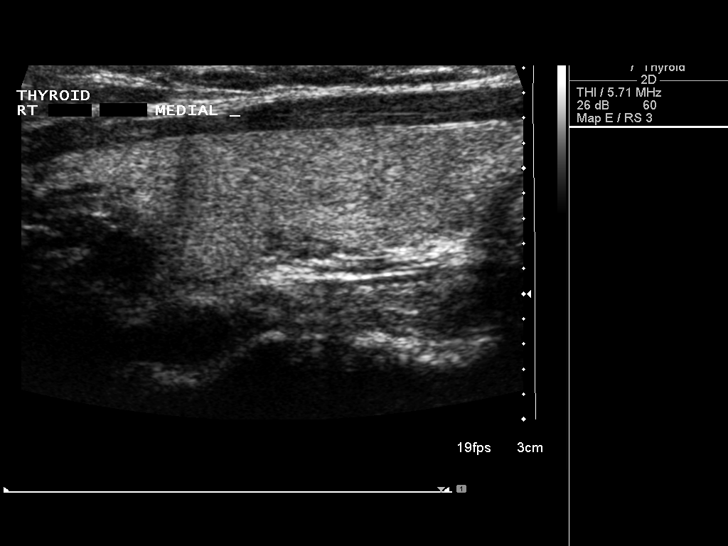
[im 9/51]
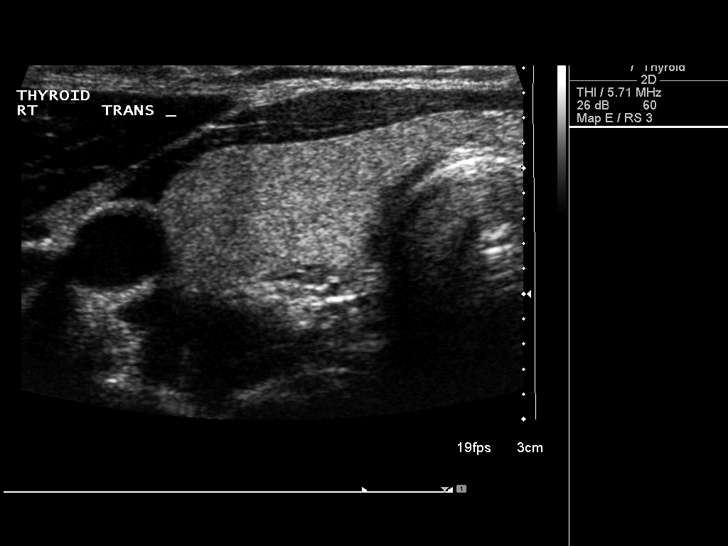
[im 13/51]
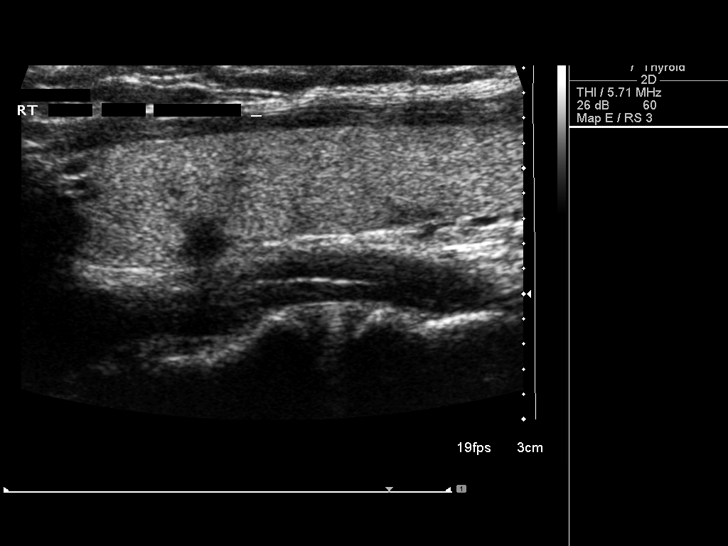
[im 17/51]
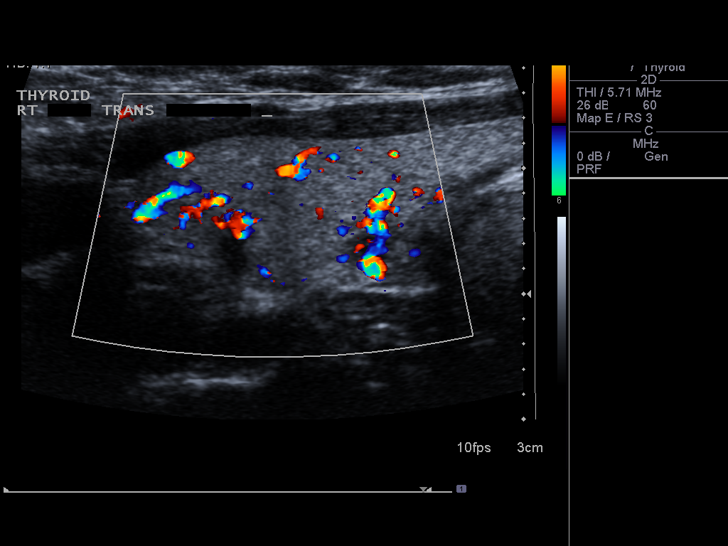
[im 19/51]
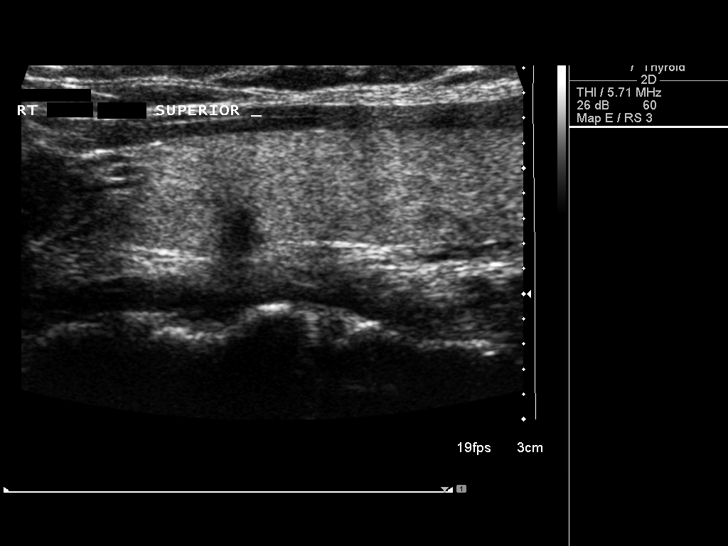
[im 23/51]
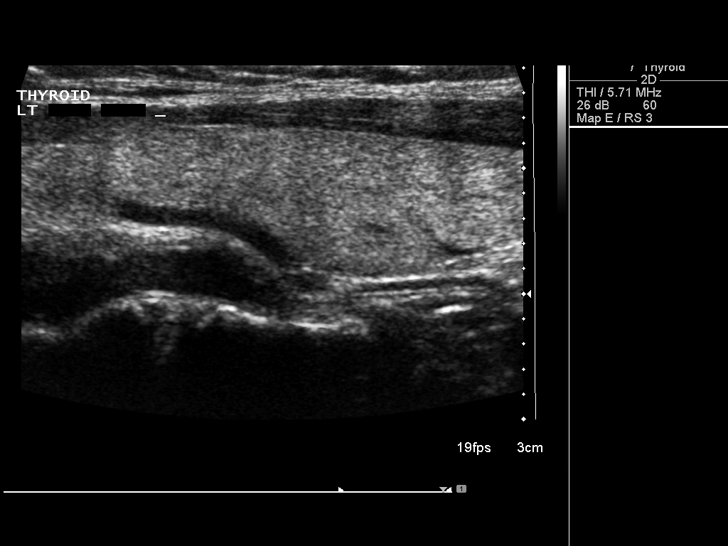
[im 28/51]
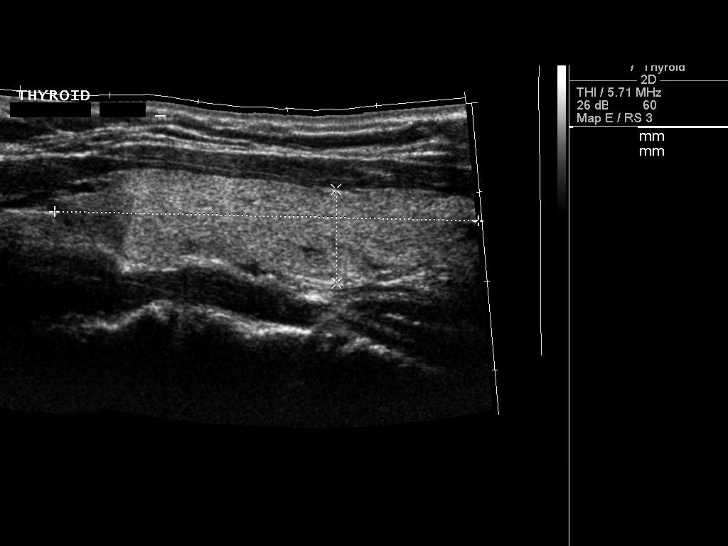
[im 32/51]
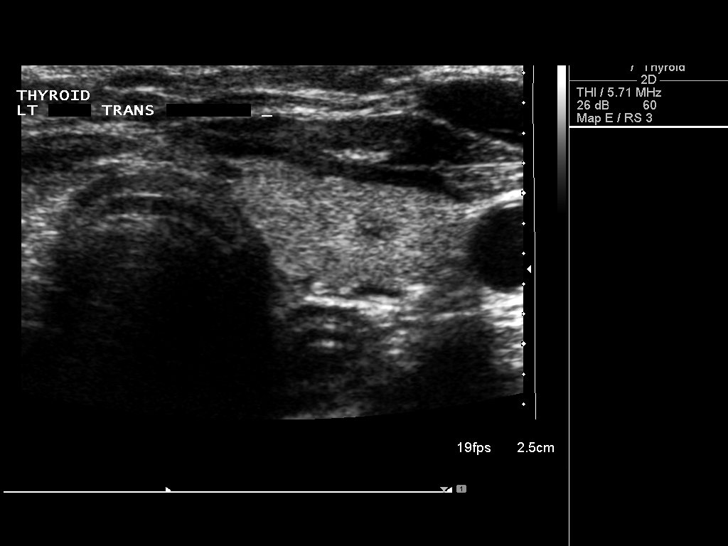
[im 34/51]
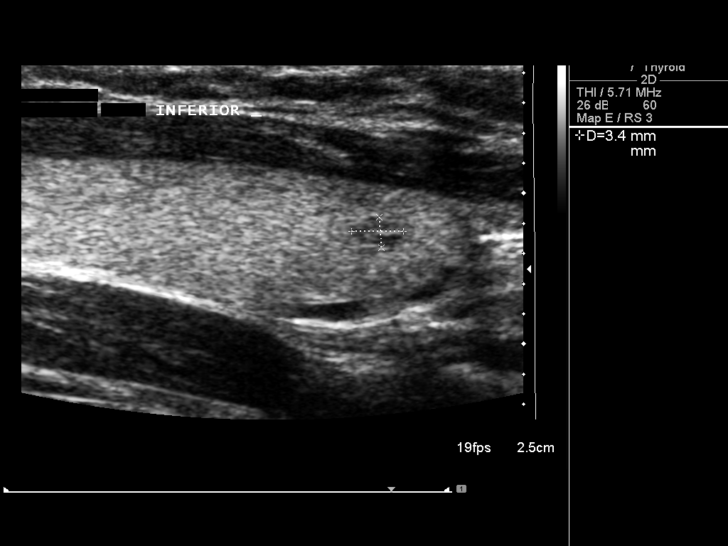
[im 38/51]
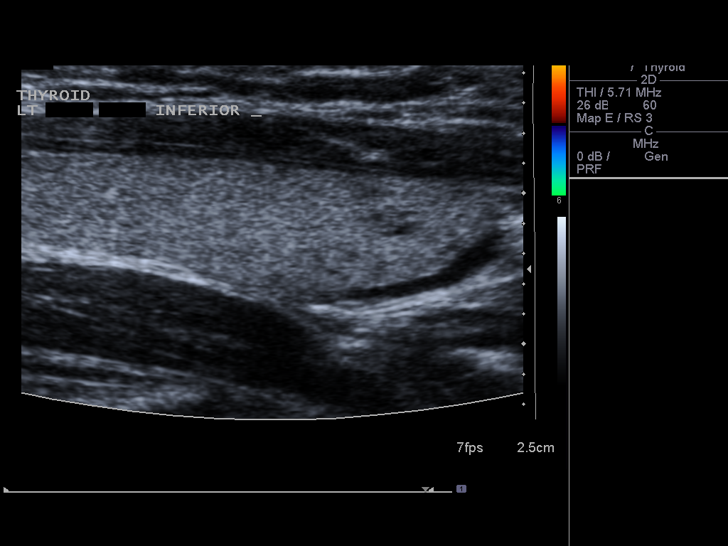
[im 42/51]
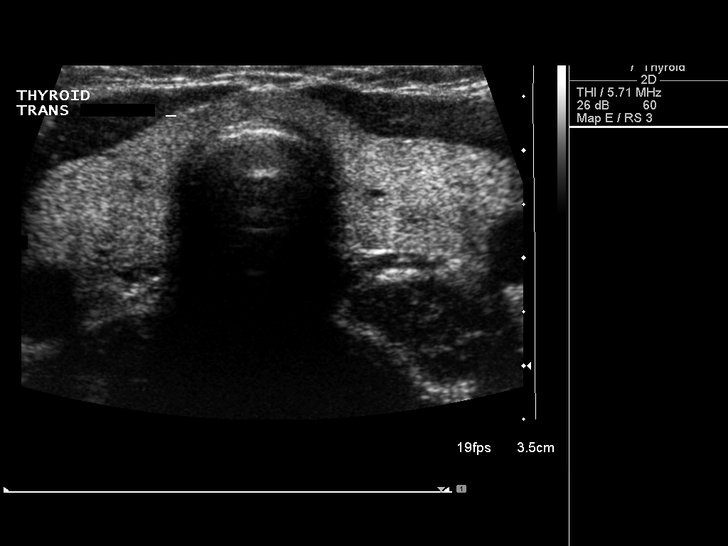
[im 46/51]
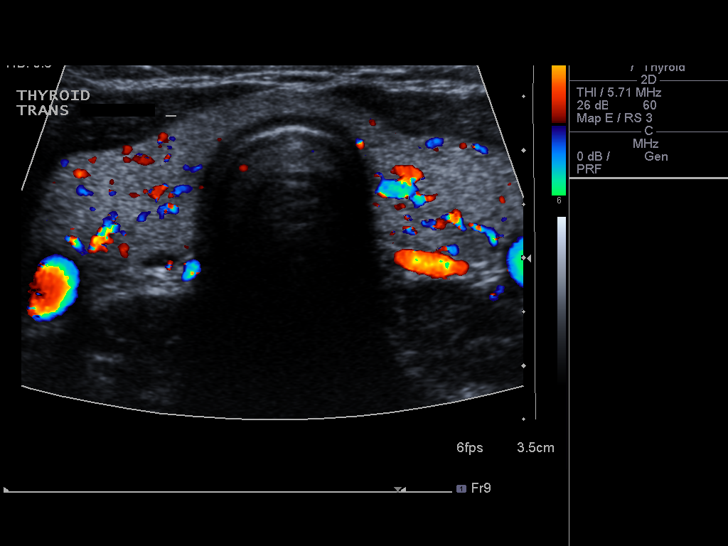
[im 51/51]
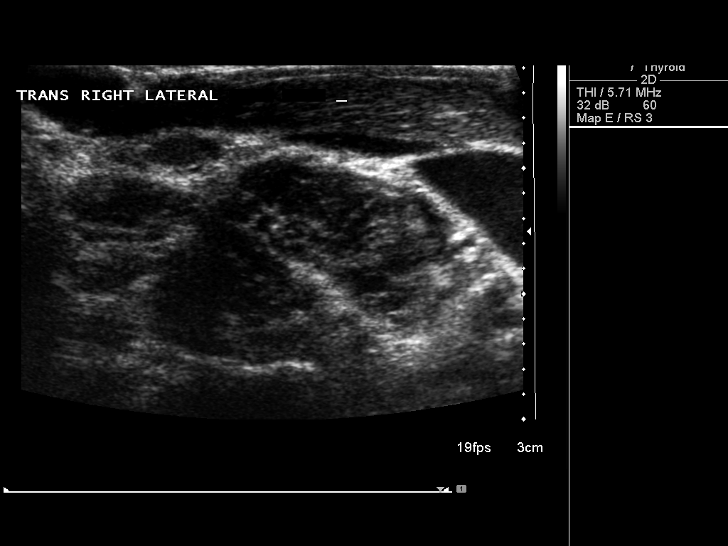

[14 of 25 positions shown; findings below may reference images not displayed]

FINDINGS: Right thyroid lobe

Measurements: 4.6 x 1.0 x 1.8 cm. Small 0.4 cm hypoechoic area in
the superior right lobe has a benign appearance.

Left thyroid lobe

Measurements: 4.7 x 1.1 x 1.8 cm.. Tiny hypoechoic nodular area in
the inferior left lobe measures 0.3 cm.

Isthmus

Thickness: 0.3 cm.  No nodules visualized.

Lymphadenopathy

None visualized.
IMPRESSION: Normal sized thyroid gland. Small bilateral subcentimeter hypoechoic
nodular areas have a likely benign appearance.

## 2015-06-22 ENCOUNTER — Emergency Department (HOSPITAL_BASED_OUTPATIENT_CLINIC_OR_DEPARTMENT_OTHER)
Admission: EM | Admit: 2015-06-22 | Discharge: 2015-06-22 | Disposition: A | Discharge status: Home / Self Care | Payer: BLUE CROSS/BLUE SHIELD | Attending: Emergency Medicine | Admitting: Emergency Medicine

## 2015-06-22 ENCOUNTER — Emergency Department (HOSPITAL_BASED_OUTPATIENT_CLINIC_OR_DEPARTMENT_OTHER): Payer: BLUE CROSS/BLUE SHIELD

## 2015-06-22 ENCOUNTER — Encounter (HOSPITAL_BASED_OUTPATIENT_CLINIC_OR_DEPARTMENT_OTHER): Payer: Self-pay

## 2015-06-22 DIAGNOSIS — S52692A Other fracture of lower end of left ulna, initial encounter for closed fracture: Secondary | ICD-10-CM | POA: Diagnosis not present

## 2015-06-22 DIAGNOSIS — Y92331 Roller skating rink as the place of occurrence of the external cause: Secondary | ICD-10-CM | POA: Insufficient documentation

## 2015-06-22 DIAGNOSIS — S62109A Fracture of unspecified carpal bone, unspecified wrist, initial encounter for closed fracture: Secondary | ICD-10-CM

## 2015-06-22 DIAGNOSIS — S52592A Other fractures of lower end of left radius, initial encounter for closed fracture: Secondary | ICD-10-CM | POA: Insufficient documentation

## 2015-06-22 DIAGNOSIS — Y998 Other external cause status: Secondary | ICD-10-CM | POA: Diagnosis not present

## 2015-06-22 DIAGNOSIS — S6992XA Unspecified injury of left wrist, hand and finger(s), initial encounter: Secondary | ICD-10-CM | POA: Diagnosis present

## 2015-06-22 DIAGNOSIS — Y9351 Activity, roller skating (inline) and skateboarding: Secondary | ICD-10-CM | POA: Insufficient documentation

## 2015-06-22 DIAGNOSIS — R202 Paresthesia of skin: Secondary | ICD-10-CM | POA: Insufficient documentation

## 2015-06-22 DIAGNOSIS — S62102A Fracture of unspecified carpal bone, left wrist, initial encounter for closed fracture: Secondary | ICD-10-CM

## 2015-06-22 MED ORDER — NAPROXEN 500 MG PO TABS: 500.0000 mg | ORAL_TABLET | Freq: Two times a day (BID) | ORAL | Status: DC | PRN

## 2015-06-22 MED ORDER — LORAZEPAM 2 MG/ML IJ SOLN
1.0000 mg | Freq: Once | INTRAMUSCULAR | Status: AC
  Administered 2015-06-22: 1 mg via INTRAVENOUS
  Filled 2015-06-22: qty 1

## 2015-06-22 MED ORDER — FENTANYL CITRATE (PF) 100 MCG/2ML IJ SOLN
INTRAMUSCULAR | Status: AC
  Administered 2015-06-22: 100 ug via INTRAVENOUS
  Filled 2015-06-22: qty 2

## 2015-06-22 MED ORDER — LIDOCAINE-EPINEPHRINE 2 %-1:100000 IJ SOLN
20.0000 mL | Freq: Once | INTRAMUSCULAR | Status: AC
  Administered 2015-06-22: 20 mL
  Filled 2015-06-22: qty 1

## 2015-06-22 MED ORDER — FENTANYL CITRATE (PF) 100 MCG/2ML IJ SOLN
50.0000 ug | Freq: Once | INTRAMUSCULAR | Status: AC
  Administered 2015-06-22: 50 ug via INTRAVENOUS
  Filled 2015-06-22: qty 2

## 2015-06-22 MED ORDER — MORPHINE SULFATE (PF) 4 MG/ML IV SOLN
4.0000 mg | Freq: Once | INTRAVENOUS | Status: AC
  Administered 2015-06-22: 4 mg via INTRAVENOUS
  Filled 2015-06-22: qty 1

## 2015-06-22 MED ORDER — HYDROCODONE-ACETAMINOPHEN 5-325 MG PO TABS: 1.0000 | ORAL_TABLET | Freq: Four times a day (QID) | ORAL | Status: DC | PRN

## 2015-06-22 MED ORDER — FENTANYL CITRATE (PF) 100 MCG/2ML IJ SOLN
100.0000 ug | Freq: Once | INTRAMUSCULAR | Status: AC
  Administered 2015-06-22: 100 ug via INTRAVENOUS

## 2015-06-22 MED FILL — HYDROCODON-APAP 5-325: 5-325 | 3 days supply | Qty: 30 | Fill #0

## 2015-06-22 MED FILL — NAPROXEN 500 MG TABLET: 500 | 10 days supply | Qty: 20 | Fill #0

## 2015-06-22 NOTE — ED Notes (Signed)
PA and MD back at bedside to re-numb and attempt to realign wrist bones

## 2015-06-22 NOTE — ED Notes (Signed)
Patient here with left wrist pain and deformity after falling last pm while roller skating, positive pulse noted, iced on arrival

## 2015-06-22 NOTE — ED Notes (Signed)
MD and PA at bedside to set left wrist, spouse remains at bedside

## 2015-06-22 NOTE — Discharge Instructions (Signed)
Wear wrist splint until you see the orthopedist. Ice and elevate wrist throughout the day. Alternate between naprosyn and norco for pain relief. Do not drive or operate machinery with pain medication use. Call hand specialist listed above today to schedule followup appointment for tomorrow, he is expecting your call. He will see you tomorrow in the office and likely fix your wrist on Wednesday. Return to the ER for changes or worsening symptoms.   Wrist Fracture A wrist fracture is a break or crack in one of the bones of your wrist. Your wrist is made up of eight small bones at the palm of your hand (carpal bones) and two long bones that make up your forearm (radius and ulna). CAUSES  A direct blow to the wrist.  Falling on an outstretched hand.  Trauma, such as a car accident or a fall. RISK FACTORS Risk factors for wrist fracture include:  Participating in contact and high-risk sports, such as skiing, biking, and ice skating.  Taking steroid medicines.  Smoking.  Being female.  Being Caucasian.  Drinking more than three alcoholic beverages per day.  Having low or lowered bone density (osteoporosis or osteopenia).  Age. Older adults have decreased bone density.  Women who have had menopause.  History of previous fractures. SIGNS AND SYMPTOMS Symptoms of wrist fractures include tenderness, bruising, and inflammation. Additionally, the wrist may hang in an odd position or appear deformed. DIAGNOSIS Diagnosis may include:  Physical exam.  X-ray. TREATMENT Treatment depends on many factors, including the nature and location of the fracture, your age, and your activity level. Treatment for wrist fracture can be nonsurgical or surgical. Nonsurgical Treatment A plaster cast or splint may be applied to your wrist if the bone is in a good position. If the fracture is not in good position, it may be necessary for your health care provider to realign it before applying a splint or  cast. Usually, a cast or splint will be worn for several weeks. Surgical Treatment Sometimes the position of the bone is so far out of place that surgery is required to apply a device to hold it together as it heals. Depending on the fracture, there are a number of options for holding the bone in place while it heals, such as a cast and metal pins. HOME CARE INSTRUCTIONS  Keep your injured wrist elevated and move your fingers as much as possible.  Do not put pressure on any part of your cast or splint. It may break.  Use a plastic bag to protect your cast or splint from water while bathing or showering. Do not lower your cast or splint into water.  Take medicines only as directed by your health care provider.  Keep your cast or splint clean and dry. If it becomes wet, damaged, or suddenly feels too tight, contact your health care provider right away.  Do not use any tobacco products including cigarettes, chewing tobacco, or electronic cigarettes. Tobacco can delay bone healing. If you need help quitting, ask your health care provider.  Keep all follow-up visits as directed by your health care provider. This is important.  Ask your health care provider if you should take supplements of calcium and vitamins C and D to promote bone healing. SEEK MEDICAL CARE IF:  Your cast or splint is damaged, breaks, or gets wet.  You have a fever.  You have chills.  You have continued severe pain or more swelling than you did before the cast was put on. SEEK  IMMEDIATE MEDICAL CARE IF:  Your hand or fingernails on the injured arm turn blue or gray, or feel cold or numb.  You have decreased feeling in the fingers of your injured arm. MAKE SURE YOU:  Understand these instructions.  Will watch your condition.  Will get help right away if you are not doing well or get worse.   This information is not intended to replace advice given to you by your health care provider. Make sure you discuss any  questions you have with your health care provider.   Document Released: 02/02/2005 Document Revised: 01/14/2015 Document Reviewed: 05/13/2011 Elsevier Interactive Patient Education 2016 Else[employer] Inc[employer]  Wrist Splint A wrist splint holds your wrist in a set position so that it does not move. A wrist splint supports your wrist but is flexible. It can be removed or loosened. You may need a wrist splint if you hurt your wrist or have swelling in your wrist. A wrist splint can:  Support your wrist.  Protect a wrist injury.  Prevent another wrist injury.  Keep your wrist from moving.  Reduce pain.  Help your wrist heal. RISKS AND COMPLICATIONS If you wear your splint too tight or have a lot of swelling, it can reduce the blood supply to your wrist or hand. This can cause a condition called compartment syndrome. It can be dangerous and cause lasting damage. Symptoms are:  Pain in your wrist that is getting worse.  Tingling and numbness.  Changes in skin color (paleness or a bluish color).  Cold fingers. Other risks of wearing a splint may be:   Skin irritation that can cause:  Itching.  Rash.  Skin sores.  Skin infection.  Wrist stiffness. This can happen if you have worn a splint for a long time. HOW TO USE YOUR WRIST SPLINT Your wrist splint should be tight enough to support your wrist. It should not cut off your blood supply. Your doctor will tell you how to wear your wrist splint and for how long.  Follow all your doctor's directions.  Take medicine only as told by your doctor.  Keep your wrist above the level of your heart (elevated) while resting.  Ice may help reduce pain and swelling.  Place ice in a plastic bag.  Place a towel between your splint and the bag.  Leave the ice on for 20 minutes, 2-3 times a day.  Do not get your splint wet.  Do not push objects under your splint to scratch your skin.  Loosen your splint if it feels too tight. Talk to your  doctor if you have questions about how tight to wear the splint.  Keep all follow-up visits as told by your doctor. This is important. GET HELP IF:   You have wrist pain or swelling that does not go away.  The skin around or under your splint becomes red, itchy, or moist.  You have chills or fever.  Your splint feels too tight or too loose.  Your splint breaks. GET HELP RIGHT AWAY IF:  You have:  Pain in your wrist that is getting worse.  Tingling and numbness.  Changes in skin color (paleness or a bluish color).  Cold fingers. MAKE SURE YOU:   Understand these instructions.  Will watch your condition.  Will get help right away if you are not doing well or get worse.   This information is not intended to replace advice given to you by your health care provider. Make sure you discuss  any questions you have with your health care provider.   Document Released: 10/12/2007 Document Revised: 05/16/2014 Document Reviewed: 08/06/2013 Elsevier Interactive Patient Education 2016 Else[employer] Inc.  Paresthesia Paresthesia is a burning or prickling feeling. This feeling can happen in any part of the body. It often happens in the hands, arms, legs, or feet. Usually, it is not painful. In most cases, the feeling goes away in a short time and is not a sign of a serious problem. HOME CARE  Avoid drinking alcohol.  Try massage or needle therapy (acupuncture) to help with your problems.  Keep all follow-up visits as told by your doctor. This is important. GET HELP IF:  You keep on having episodes of paresthesia.  Your burning or prickling feeling gets worse when you walk.  You have pain or cramps.  You feel dizzy.  You have a rash. GET HELP RIGHT AWAY IF:  You feel weak.  You have trouble walking or moving.  You have problems speaking, understanding, or seeing.  You feel confused.  You cannot control when you pee (urinate) or poop (bowel movement).  You lose feeling  (numbness) after an injury.  You pass out (faint).   This information is not intended to replace advice given to you by your health care provider. Make sure you discuss any questions you have with your health care provider.   Document Released: 04/07/2008 Document Revised: 09/09/2014 Document Reviewed: 04/21/2014 Elsevier Interactive Patient Education 2016 Elsevier Inc.  Cast or Splint Care Casts and splints support injured limbs and keep bones from moving while they heal.  HOME CARE  Keep the cast or splint uncovered during the drying period.  A plaster cast can take 24 to 48 hours to dry.  A fiberglass cast will dry in less than 1 hour.  Do not rest the cast on anything harder than a pillow for 24 hours.  Do not put weight on your injured limb. Do not put pressure on the cast. Wait for your doctor's approval.  Keep the cast or splint dry.  Cover the cast or splint with a plastic bag during baths or wet weather.  If you have a cast over your chest and belly (trunk), take sponge baths until the cast is taken off.  If your cast gets wet, dry it with a towel or blow dryer. Use the cool setting on the blow dryer.  Keep your cast or splint clean. Wash a dirty cast with a damp cloth.  Do not put any objects under your cast or splint.  Do not scratch the skin under the cast with an object. If itching is a problem, use a blow dryer on a cool setting over the itchy area.  Do not trim or cut your cast.  Do not take out the padding from inside your cast.  Exercise your joints near the cast as told by your doctor.  Raise (elevate) your injured limb on 1 or 2 pillows for the first 1 to 3 days. GET HELP IF:  Your cast or splint cracks.  Your cast or splint is too tight or too loose.  You itch badly under the cast.  Your cast gets wet or has a soft spot.  You have a bad smell coming from the cast.  You get an object stuck under the cast.  Your skin around the cast becomes  red or sore.  You have new or more pain after the cast is put on. GET HELP RIGHT AWAY IF:  You  have fluid leaking through the cast.  You cannot move your fingers or toes.  Your fingers or toes turn blue or white or are cool, painful, or puffy (swollen).  You have tingling or lose feeling (numbness) around the injured area.  You have bad pain or pressure under the cast.  You have trouble breathing or have shortness of breath.  You have chest pain.   This information is not intended to replace advice given to you by your health care provider. Make sure you discuss any questions you have with your health care provider.   Document Released: 08/25/2010 Document Revised: 12/26/2012 Document Reviewed: 11/01/2012 Elsevier Interactive Patient Education [employer]

## 2015-06-22 NOTE — ED Provider Notes (Signed)
CSN: [id number]     Arrival date & time 06/22/15  4098 History   First MD Initiated Contact with Patient 06/22/15 [phone]     Chief Complaint  Patient presents with  . Wrist Injury     (Consider location/radiation/quality/duration/timing/severity/associated sxs/prior Treatment) HPI Comments: Nancy Conley is a 35 y.o. female, who presents to the ED with complaints of left wrist pain after falling on a forcibly flexed wrist while she was skating last night around 10:15 PM. She scrubs the pain is 8/10 intermittent aching in the left wrist, nonradiating, worse with movement, and improved with 800 ibuprofen as well as ice. Associated symptoms include swelling, deformity, painful ROM of wrist, and intermittent tingling in the fingers. She denies any fevers or chills overnight, numbness, focal weakness (aside from loss of ROM due to pain in wrist), hand or elbow pain, other injuries sustained during fall, head injury, loss of consciousness, abrasions, bruising, chest pain, shortness breath, abdominal pain, nausea, vomiting, dysuria, or hematuria. She has not eaten anything since 8:30pm last night.  Patient is a 35 y.o. female presenting with wrist injury. The history is provided by the patient. No language interpreter was used.  Wrist Injury Location:  Wrist Time since incident:  12 hours Injury: yes   Mechanism of injury: fall   Fall:    Fall occurred: skating.   Point of impact: wrist flexed downward.   Entrapped after fall: no   Wrist location:  L wrist Pain details:    Quality:  Aching   Radiates to:  Does not radiate   Severity:  Moderate   Onset quality:  Sudden   Duration:  12 hours   Timing:  Intermittent   Progression:  Unchanged Chronicity:  New Foreign body present:  No foreign bodies Prior injury to area:  No Relieved by:  NSAIDs and ice Worsened by:  Movement Ineffective treatments:  None tried Associated symptoms: decreased range of motion (due to pain), swelling and  tingling   Associated symptoms: no fever, no muscle weakness and no numbness     Past Medical History  Diagnosis Date  . Medical history non-contributory   . Vaginal Pap smear, abnormal    Past Surgical History  Procedure Laterality Date  . Wisdom tooth extraction    . Gynecologic cryosurgery    . Cesarean section N/A 06/18/2014    Procedure: CESAREAN SECTION;  Surgeon: Geryl Rankins, MD;  Location: [facility];  Service: Obstetrics;  Laterality: N/A;   Family History  Problem Relation Age of Onset  . Hypertension Mother   . Diabetes Father   . Hypertension Father   . Cancer Maternal Aunt     breast  . Arthritis Maternal Uncle   . Cancer Maternal Uncle     lung  . Kidney disease Paternal Uncle   . Hypertension Maternal Grandmother   . Stroke Maternal Grandmother   . Hypertension Paternal Grandmother   . Alcohol abuse Neg Hx   . Asthma Neg Hx   . Birth defects Neg Hx   . COPD Neg Hx   . Depression Neg Hx   . Drug abuse Neg Hx   . Hearing loss Neg Hx   . Early death Neg Hx   . Hyperlipidemia Neg Hx   . Heart disease Neg Hx   . Mental illness Neg Hx   . Learning disabilities Neg Hx   . Mental retardation Neg Hx   . Miscarriages / Stillbirths Neg Hx   . Vision loss Neg Hx   .  Varicose Veins Neg Hx    Social History  Substance Use Topics  . Smoking status: Never Smoker   . Smokeless tobacco: Never Used  . Alcohol Use: Yes     Comment: prior to preg   OB History    Gravida Para Term Preterm AB TAB SAB Ectopic Multiple Living   0 1     Review of Systems  Constitutional: Negative for fever and chills.  HENT: Negative for facial swelling (no head inj).   Respiratory: Negative for shortness of breath.   Cardiovascular: Negative for chest pain.  Gastrointestinal: Negative for nausea, vomiting and abdominal pain.  Genitourinary: Negative for dysuria and hematuria.  Musculoskeletal: Positive for joint swelling and arthralgias. Negative for myalgias.  Skin:  Negative for color change and wound.  Allergic/Immunologic: Negative for immunocompromised state.  Neurological: Negative for syncope, weakness and numbness.       +tingling in L hand  Psychiatric/Behavioral: Negative for confusion.   10 Systems reviewed and are negative for acute change except as noted in the HPI.    Allergies  Review of patient's allergies indicates no known allergies.  Home Medications   Prior to Admission medications   Not on File   BP 129/87 mmHg  Pulse 80  Temp(Src) 97.8 F (36.6 C) (Oral)  Resp 18  SpO2 100% Physical Exam  Constitutional: She is oriented to person, place, and time. Vital signs are normal. She appears well-developed and well-nourished.  Non-toxic appearance. No distress.  Afebrile, nontoxic, NAD  HENT:  Head: Normocephalic and atraumatic.  Mouth/Throat: Mucous membranes are normal.  Eyes: Conjunctivae and EOM are normal. Right eye exhibits no discharge. Left eye exhibits no discharge.  Neck: Normal range of motion. Neck supple.  Cardiovascular: Normal rate and intact distal pulses.   Pulmonary/Chest: Effort normal. No respiratory distress.  Abdominal: Normal appearance. She exhibits no distension.  Musculoskeletal:       Left wrist: She exhibits decreased range of motion (due to pain), bony tenderness, swelling, crepitus and deformity. She exhibits no laceration.  L wrist with swelling, deformity, and exquisite TTP diffusely, with crepitus, with limited ROM due to pain but able to wiggle all fingers without difficulty, passive ROM of fingers without pain, cap refill brisk and present, distal pulses intact, with grip strength intact, and sensation grossly intact in all digits. Compartments soft. No other forearm, hand, or elbow tenderness. Skin intact without abrasions but with slight bruising  Neurological: She is alert and oriented to person, place, and time. She has normal strength. No sensory deficit.  Skin: Skin is warm, dry and  intact. Bruising noted. No abrasion and no rash noted.  L wrist bruising as noted above  Psychiatric: She has a normal mood and affect. Her behavior is normal.  Nursing note and vitals reviewed.   ED Course  Reduction of fracture Date/Time: 06/22/2015 11:37 AM Performed by: Marjean Donna Azaylah Stailey Authorized by: Allen Derry Consent: Verbal consent obtained. Risks and benefits: risks, benefits and alternatives were discussed Consent given by: patient Patient understanding: patient states understanding of the procedure being performed Patient consent: the patient's understanding of the procedure matches consent given Patient identity confirmed: verbally with patient Preparation: Patient was prepped and draped in the usual sterile fashion. Local anesthesia used: yes Anesthesia: hematoma block Local anesthetic: lidocaine 2% with epinephrine Anesthetic total: 10 ml Patient sedated: no Patient tolerance: Patient tolerated the procedure well with no immediate complications  .Splint Application Date/Time: 06/22/2015 12:20 PM  Performed by: Allen Derry Authorized by: Allen Derry Consent: Verbal consent obtained. Risks and benefits: risks, benefits and alternatives were discussed Consent given by: patient Patient understanding: patient states understanding of the procedure being performed Patient consent: the patient's understanding of the procedure matches consent given Patient identity confirmed: verbally with patient Location details: left wrist Splint type: sugar tong Supplies used: Ortho-Glass Post-procedure: The splinted body part was neurovascularly unchanged following the procedure. Patient tolerance: Patient tolerated the procedure well with no immediate complications   (including critical care time) Labs Review Labs Reviewed - No data to display  Imaging Review Dg Wrist 2 Views Left  06/22/2015  CLINICAL DATA:  Postreduction wrist  fracture EXAM: LEFT WRIST - 2 VIEW COMPARISON:  2137 FINDINGS: Fine detail obscured by splint material. Improved alignment of the distal radial fracture. Radiocarpal joint is intact. Comminution of the distal radius. IMPRESSION: Post reduction of distal radial fracture with improved alignment. Electronically Signed   By: Genevive Bi M.D.   On: 06/22/2015 12:36   Dg Wrist Complete Left  06/22/2015  CLINICAL DATA:  Fall skating last night with left wrist deformity. Initial encounter. EXAM: LEFT WRIST - COMPLETE 3+ VIEW COMPARISON:  None. FINDINGS: Transverse fracture through the distal radial metaphysis with 100% posterior displacement and acquired ulnar positive variance. On AP and oblique projections there is evidence of extension to the articular surface, without measurable offset. Fracture through the tip of the ulnar styloid process with radial displacement. IMPRESSION: Displaced distal radius and ulna fractures as above. Electronically Signed   By: Marnee Spring M.D.   On: 06/22/2015 10:13   I have personally reviewed and evaluated these images and lab results as part of my medical decision-making.   EKG Interpretation None      MDM   Final diagnoses:  Wrist fracture, closed  Left wrist fracture, closed, initial encounter  Paresthesias in left hand    35 y.o. female here with L wrist pain/swelling/deformity s/p fall skating last night. NVI with soft compartments, obvious swelling and deformity to wrist, with exquisite tenderness in this area. Xray obtained, awaiting reading. Will give pain meds. Will reassess shortly.   10:20 AM Xray reveals transverse fx with 100% displacement of radius with extension into articular surface and a fx of ulnar styloid. Will consult hand surgery. Will place IV and give morphine IV instead of IM  10:38 AM Larita Fife, Dr Ronie Spies nurse, returning page, states she'll have him review the record and the xray images and call me back regarding next steps.  Will await his call back  10:54 AM Dr. Mina Marble returning page, would like [place] to try to reduce it. Will set up for hematoma block. Will give ativan for anxiolysis.  11:36 AM Hematoma block performed, placed in weighted reducer. Will reassess shortly and then attempt to manipulate and reduce further. Will give fentanyl prior to manipulation is attempted.  12:20 PM Manual reduction of fx performed, performed another hematoma block for ulnar styloid fx as well since pt had some residual pain there. Splinted and will obtain post-reduction films. More pain meds given during procedure. Will reassess after post-reduction film.  12:43 PM Post-reduction film with improved alignment. NVI after reduction and splint placement. Will have her call Dr. Mina Marble today to see him tomorrow and likely get this fixed on Wednesday. Pain meds given. Discussed RICE. I explained the diagnosis and have given explicit precautions to return to the ER including for any other new or worsening symptoms. The patient understands and accepts  the medical plan as it's been dictated and I have answered their questions. Discharge instructions concerning home care and prescriptions have been given. The patient is STABLE and is discharged to home in good condition.  BP 134/88 mmHg  Pulse 89  Temp(Src) 97.8 F (36.6 C) (Oral)  Resp 20  SpO2 100%  Meds ordered this encounter  Medications  . morphine 4 MG/ML injection 4 mg    Sig:   . lidocaine-EPINEPHrine (XYLOCAINE W/EPI) 2 %-1:100000 (with pres) injection 20 mL    Sig:   . LORazepam (ATIVAN) injection 1 mg    Sig:   . fentaNYL (SUBLIMAZE) injection 50 mcg    Sig:   . fentaNYL (SUBLIMAZE) 100 MCG/2ML injection    Sig:     Blue-Matthews, Jamie: cabinet override  . fentaNYL (SUBLIMAZE) injection 100 mcg    Sig:   . naproxen (NAPROSYN) 500 MG tablet    Sig: Take 1 tablet (500 mg total) by mouth 2 (two) times daily as needed for mild pain, moderate pain or headache (TAKE  WITH MEALS.).    Dispense:  20 tablet    Refill:  0    Order Specific Question:  Supervising Provider    Answer:  MILLER, BRIAN [[id number]]  . HYDROcodone-acetaminophen (NORCO) 5-325 MG tablet    Sig: Take 1-2 tablets by mouth every 6 (six) hours as needed for severe pain.    Dispense:  30 tablet    Refill:  0    Order Specific Question:  Supervising Provider    Answer:  Eber Hong [[id number]]     Kharter Sestak Camprubi-Soms, PA-C 06/22/15 1244  Melene Plan, DO 06/22/15 1524

## 2017-01-12 IMAGING — CR DG WRIST 2V*L*
2 series · 2 of 2 positions shown · non-contrast
Comparison: 3308

CLINICAL DATA: Postreduction wrist fracture

EXAM:
LEFT WRIST - 2 VIEW

[x wrist pa left *]
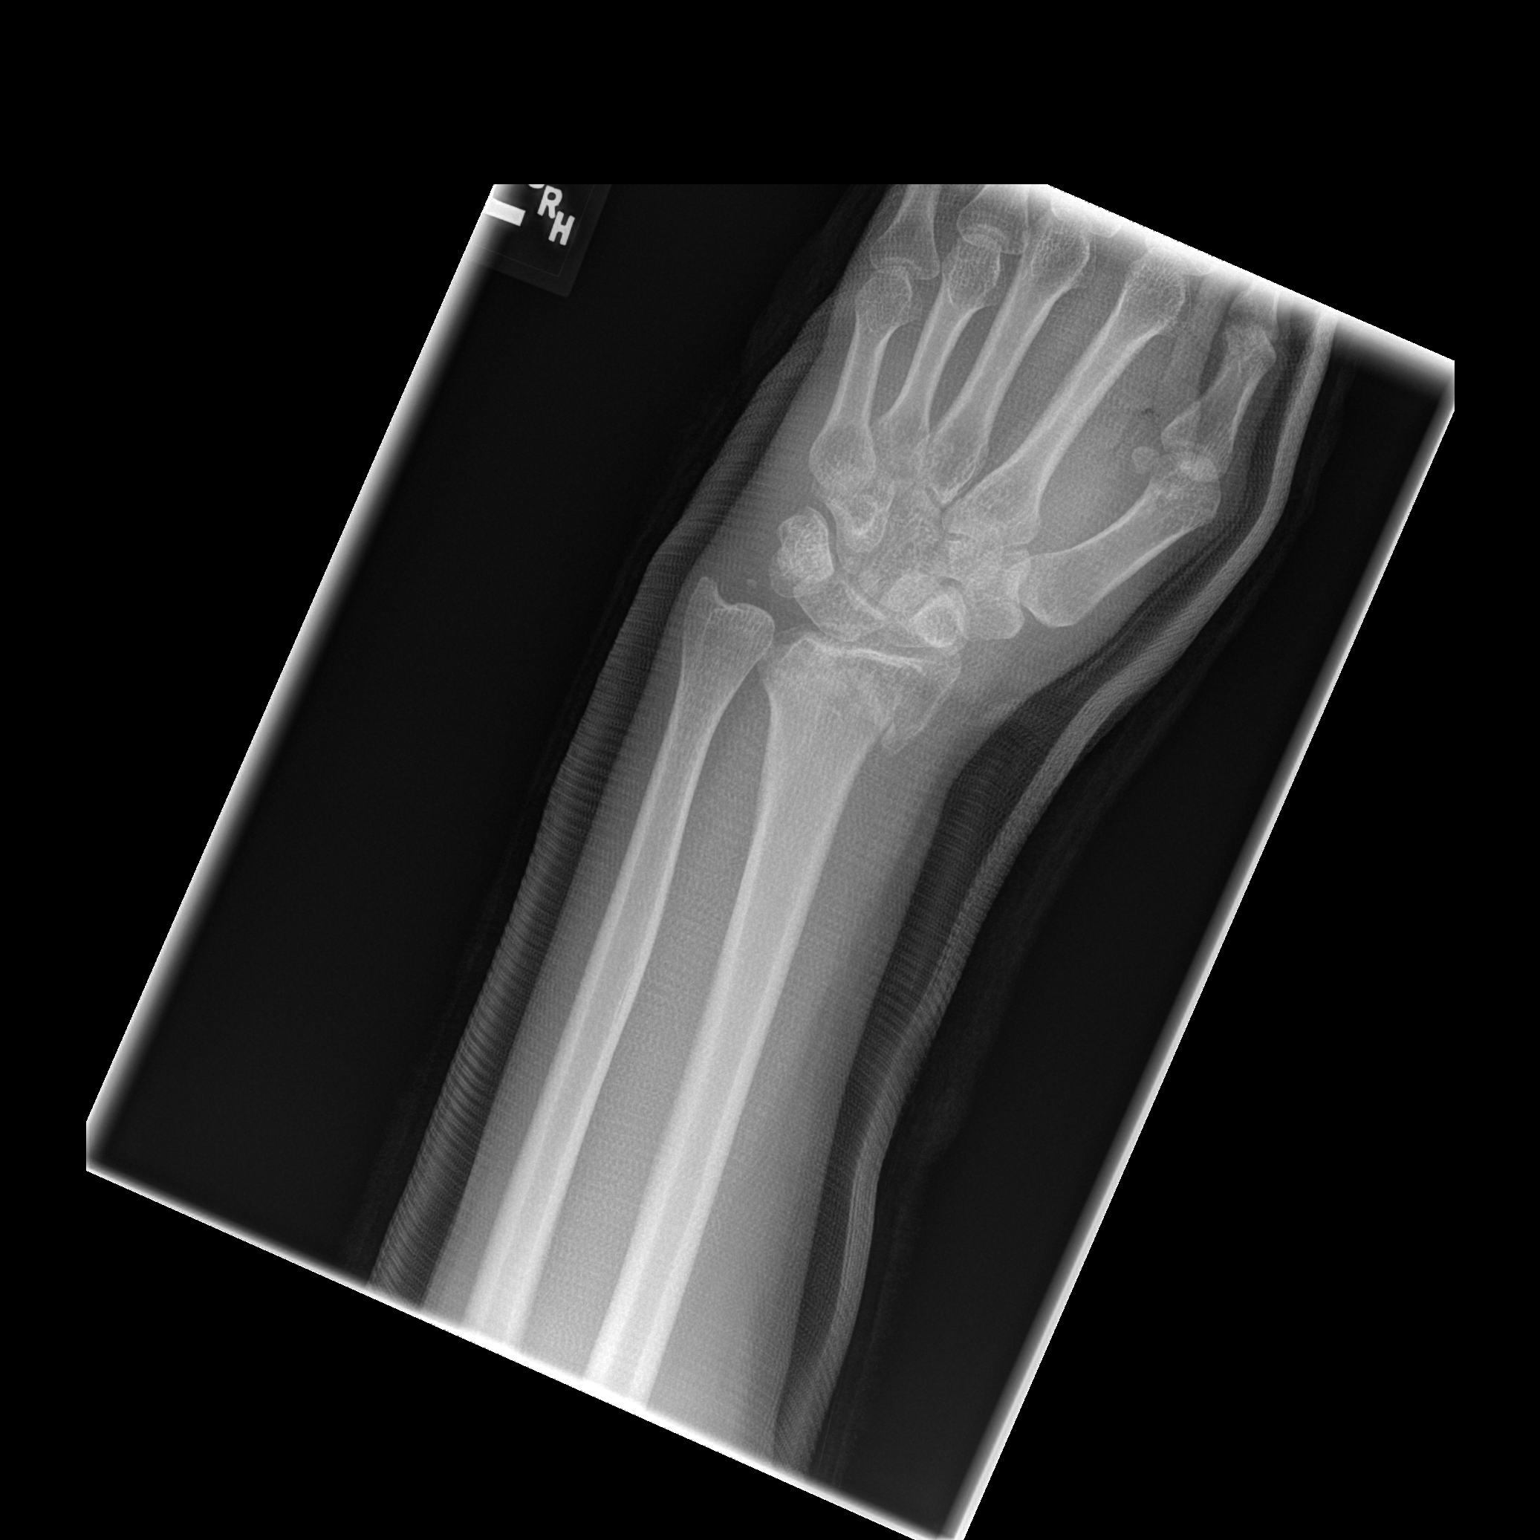

[x wrist lat left *]
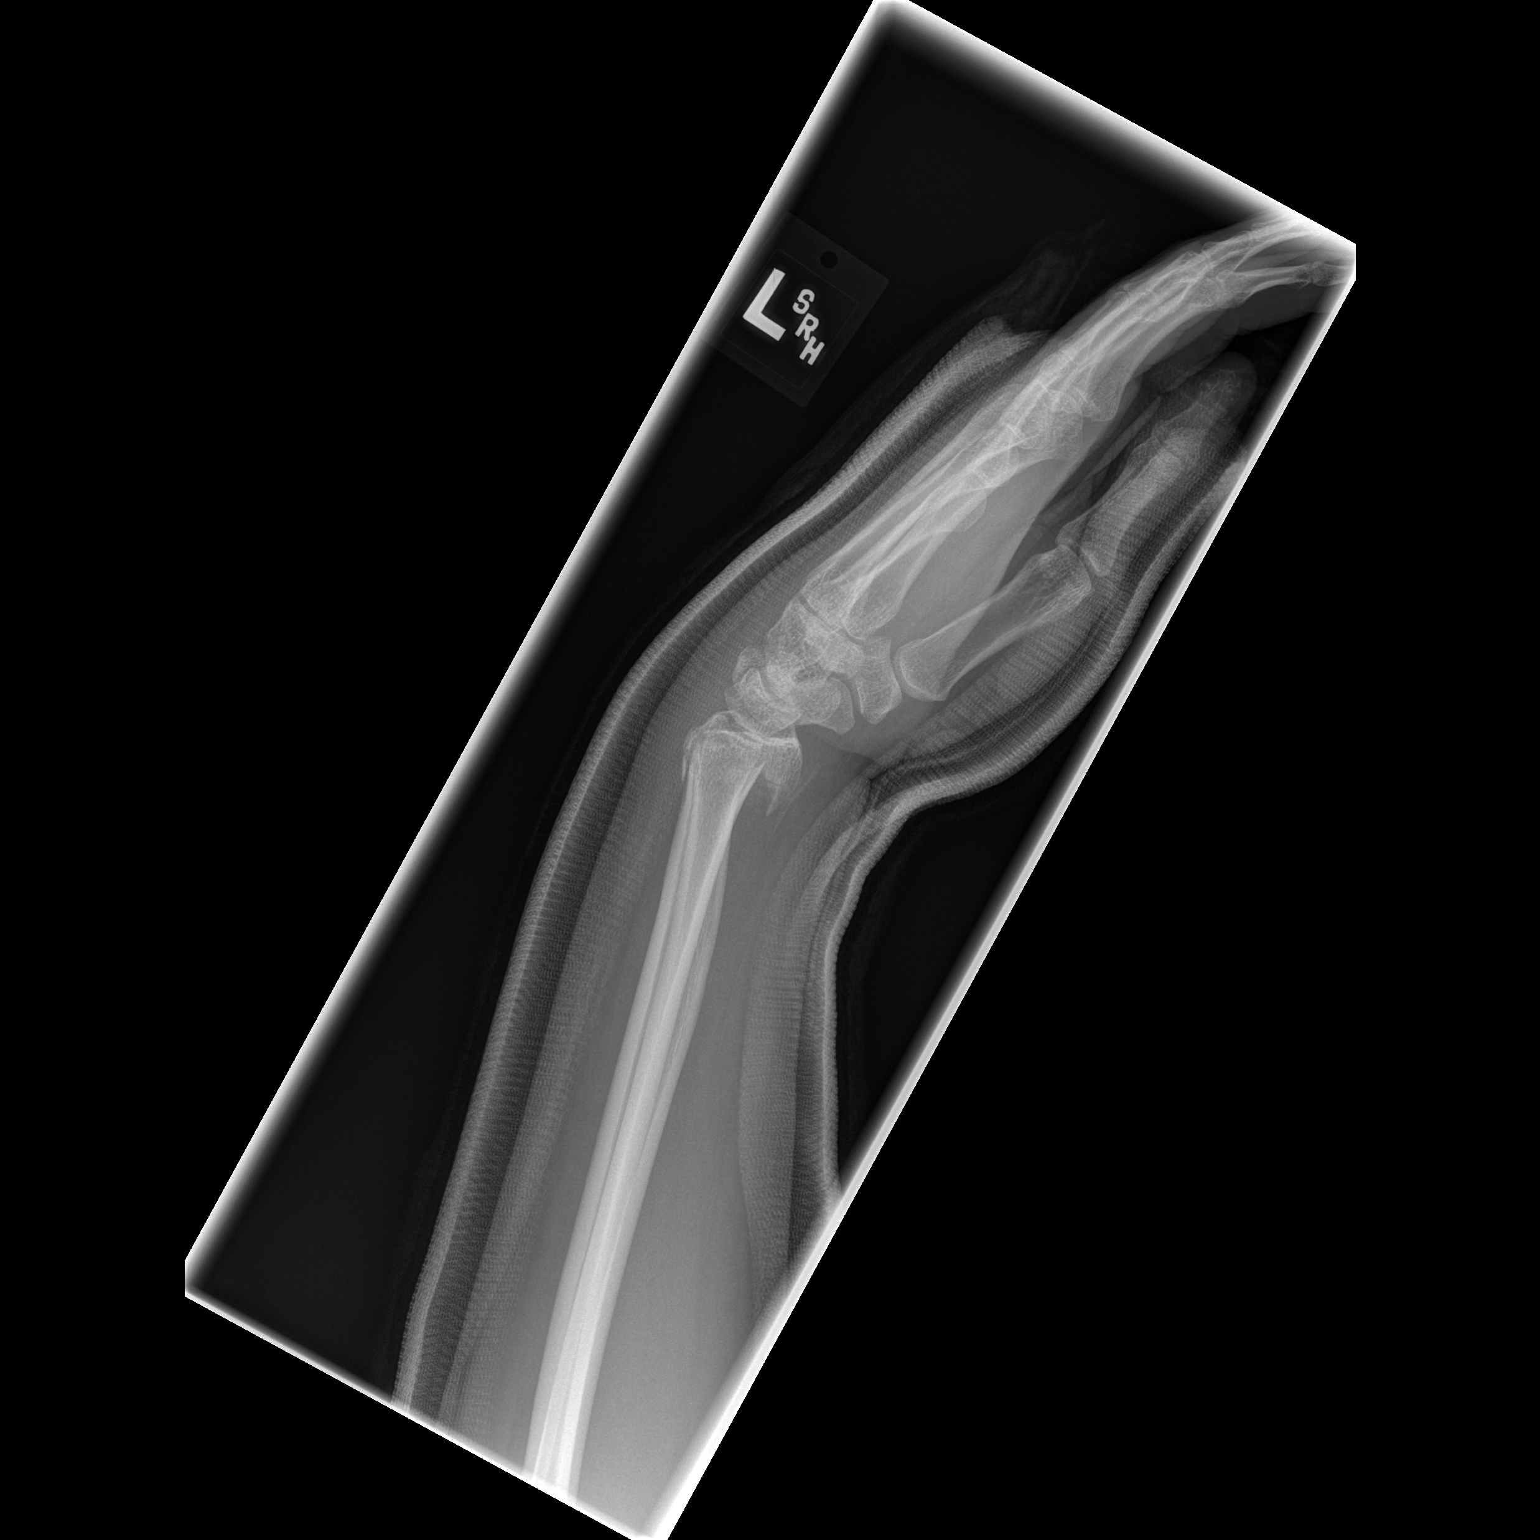

[2 of 2 positions shown; findings below may reference images not displayed]

FINDINGS: Fine detail obscured by splint material. Improved alignment of the
distal radial fracture. Radiocarpal joint is intact. Comminution of
the distal radius.
IMPRESSION: Post reduction of distal radial fracture with improved alignment.

## 2017-01-12 IMAGING — CR DG WRIST COMPLETE 3+V*L*
3 series · 3 of 3 positions shown · non-contrast
Comparison: None.

CLINICAL DATA: Fall skating last night with left wrist deformity.
Initial encounter.

EXAM:
LEFT WRIST - COMPLETE 3+ VIEW

[x wrist pa left]
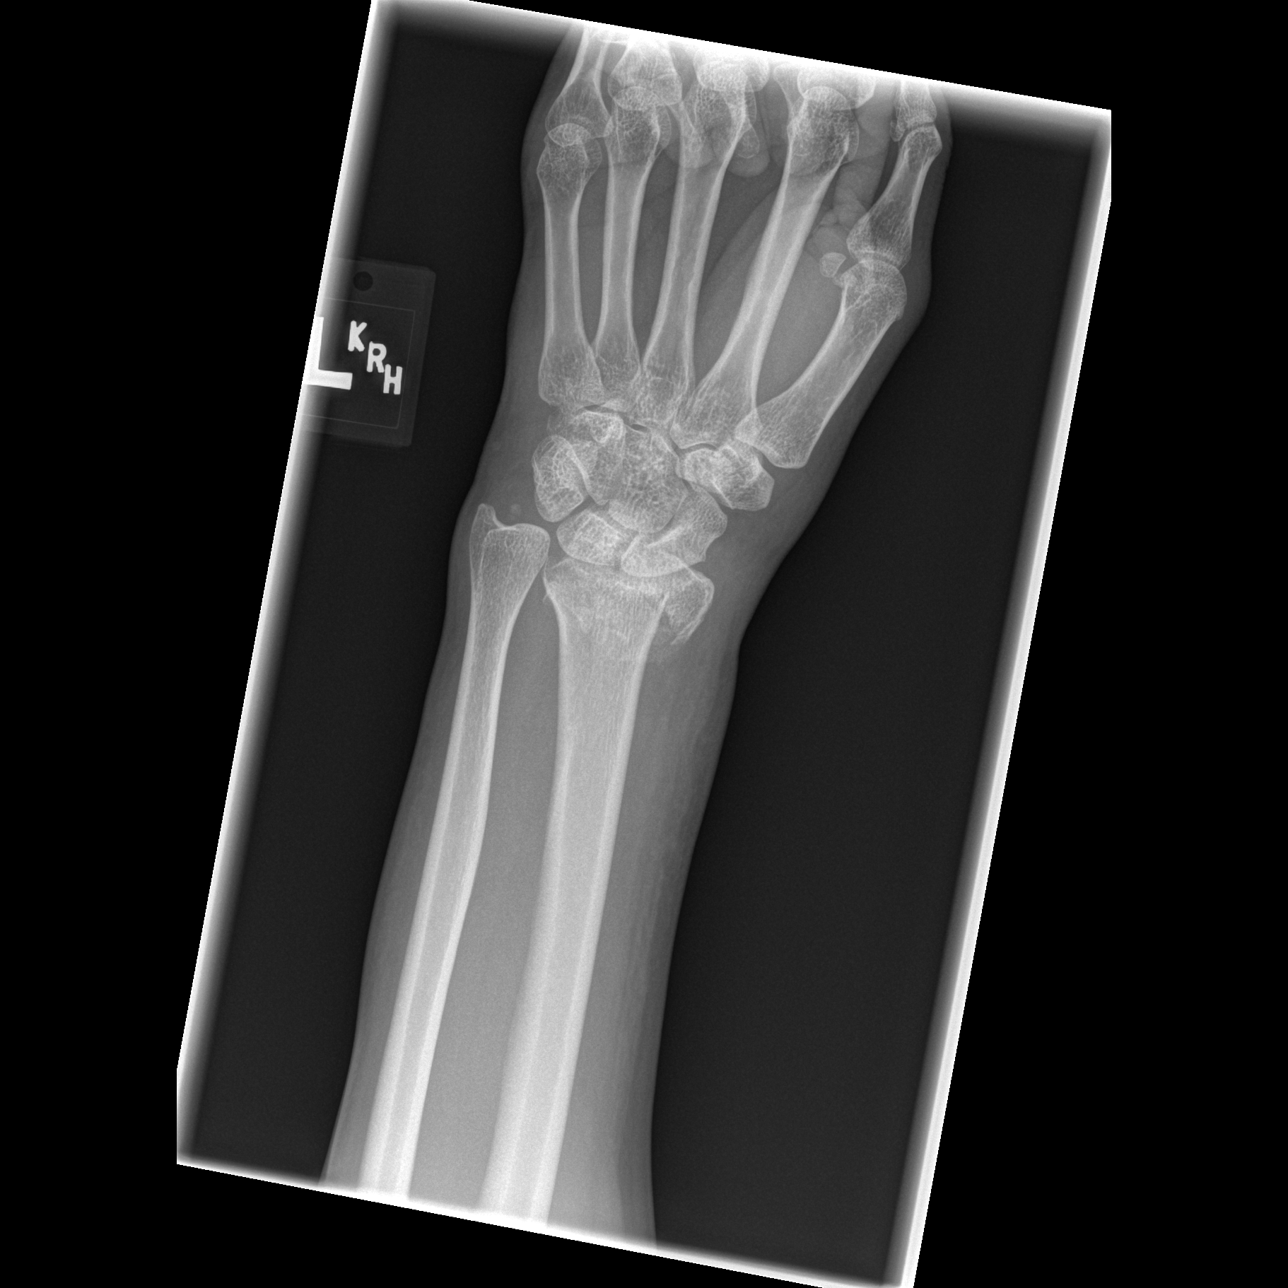

[x wrist obl left]
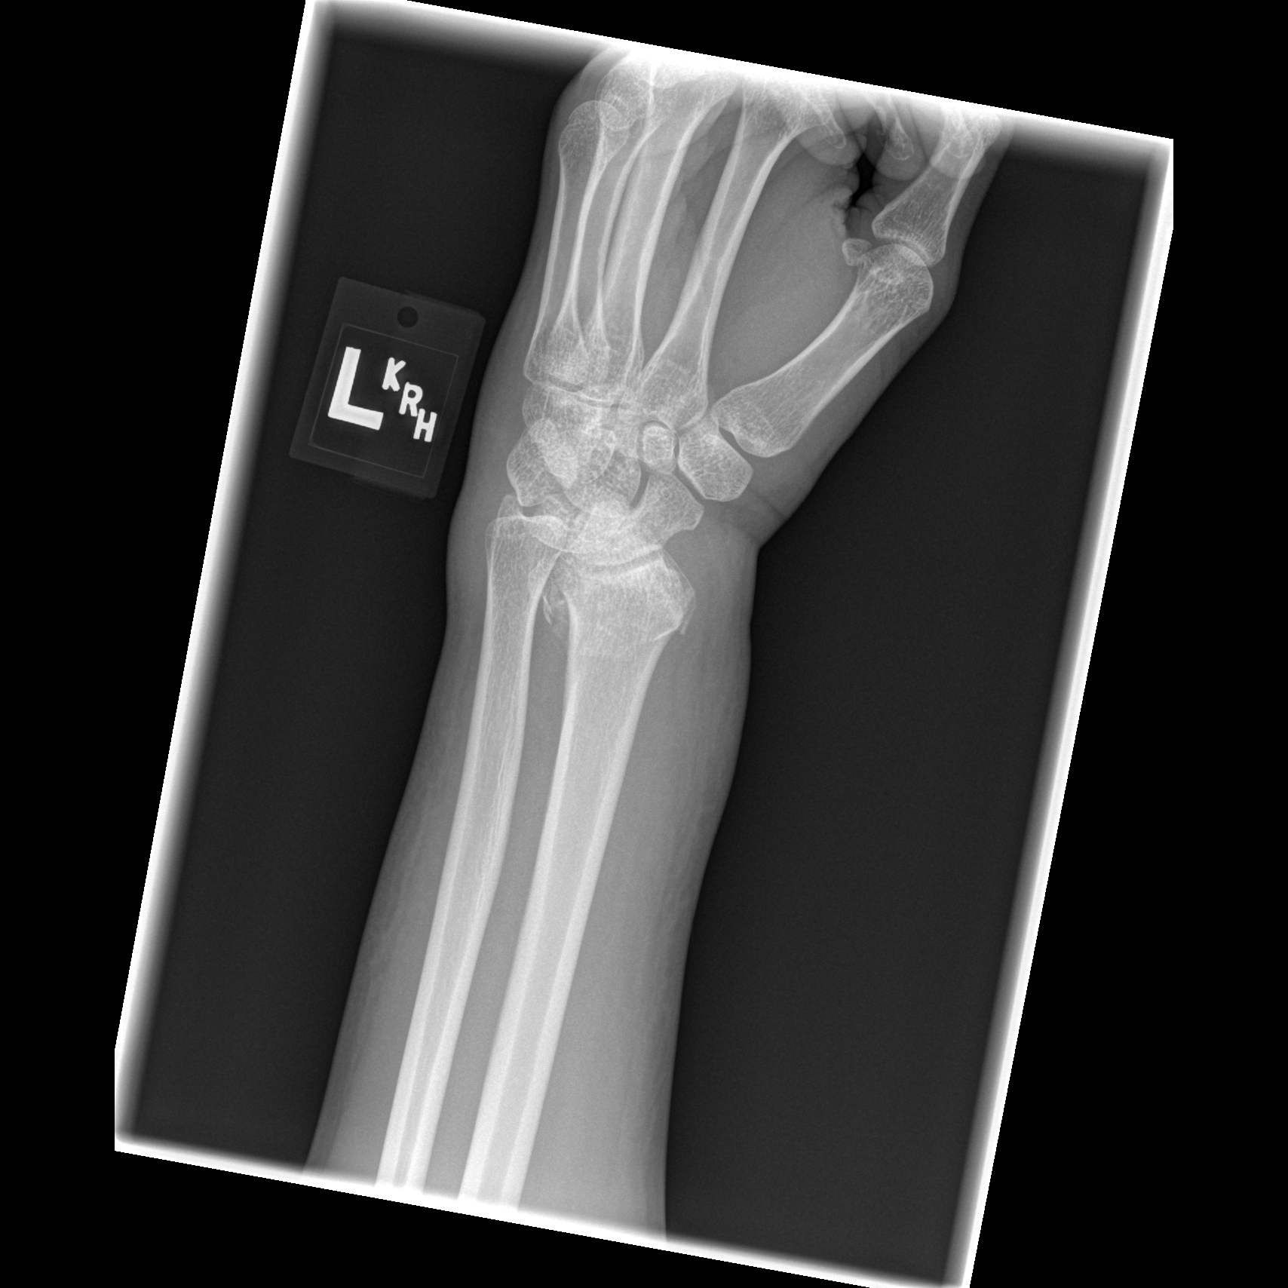

[x wrist lat left]
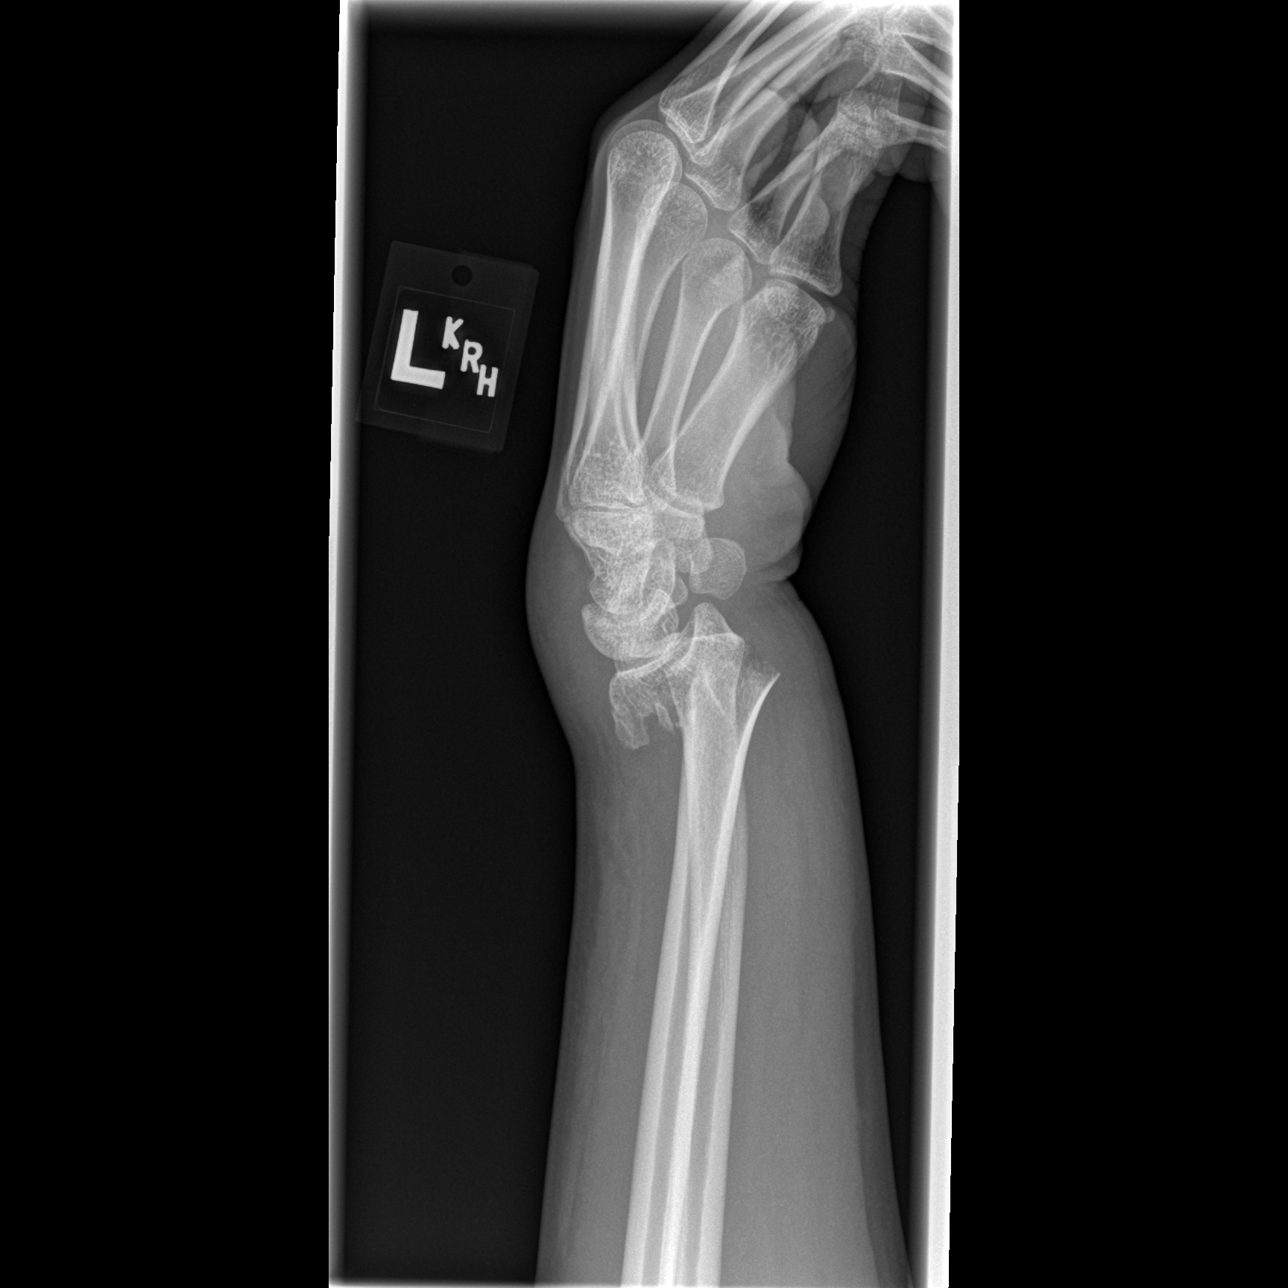

[3 of 3 positions shown; findings below may reference images not displayed]

FINDINGS: Transverse fracture through the distal radial metaphysis with 100%
posterior displacement and acquired ulnar positive variance. On AP
and oblique projections there is evidence of extension to the
articular surface, without measurable offset. Fracture through the
tip of the ulnar styloid process with radial displacement.
IMPRESSION: Displaced distal radius and ulna fractures as above.

## 2017-11-15 ENCOUNTER — Other Ambulatory Visit (HOSPITAL_COMMUNITY)
Admission: RE | Admit: 2017-11-15 | Discharge: 2017-11-15 | Disposition: A | Discharge status: Home / Self Care | Payer: 59 | Source: Ambulatory Visit | Attending: Obstetrics and Gynecology | Admitting: Obstetrics and Gynecology

## 2017-11-15 ENCOUNTER — Other Ambulatory Visit: Payer: Self-pay | Admitting: Obstetrics and Gynecology

## 2017-11-15 DIAGNOSIS — Z01411 Encounter for gynecological examination (general) (routine) with abnormal findings: Secondary | ICD-10-CM | POA: Insufficient documentation

## 2017-11-17 LAB — CYTOLOGY - PAP
Diagnosis: NEGATIVE
HPV (WINDOPATH): NOT DETECTED

## 2019-03-08 ENCOUNTER — Other Ambulatory Visit: Payer: Self-pay

## 2019-03-08 DIAGNOSIS — Z20822 Contact with and (suspected) exposure to covid-19: Secondary | ICD-10-CM

## 2019-03-09 LAB — NOVEL CORONAVIRUS, NAA: SARS-CoV-2, NAA: NOT DETECTED

## 2019-09-10 ENCOUNTER — Encounter (HOSPITAL_COMMUNITY): Payer: Self-pay

## 2019-09-10 ENCOUNTER — Inpatient Hospital Stay (HOSPITAL_COMMUNITY)
Admission: AD | Admit: 2019-09-10 | Discharge: 2019-09-13 | DRG: 787 | Disposition: A | Discharge status: Home / Self Care | Payer: 59 | Attending: Obstetrics and Gynecology | Admitting: Obstetrics and Gynecology

## 2019-09-10 ENCOUNTER — Inpatient Hospital Stay (HOSPITAL_COMMUNITY): Payer: 59 | Admitting: Anesthesiology

## 2019-09-10 ENCOUNTER — Encounter (HOSPITAL_COMMUNITY)
Admission: AD | Disposition: A | Discharge status: Home / Self Care | Payer: Self-pay | Source: Home / Self Care | Attending: Obstetrics and Gynecology

## 2019-09-10 ENCOUNTER — Other Ambulatory Visit: Payer: Self-pay

## 2019-09-10 DIAGNOSIS — O9832 Other infections with a predominantly sexual mode of transmission complicating childbirth: Secondary | ICD-10-CM | POA: Diagnosis present

## 2019-09-10 DIAGNOSIS — O34211 Maternal care for low transverse scar from previous cesarean delivery: Secondary | ICD-10-CM | POA: Diagnosis present

## 2019-09-10 DIAGNOSIS — Z20822 Contact with and (suspected) exposure to covid-19: Secondary | ICD-10-CM | POA: Diagnosis present

## 2019-09-10 DIAGNOSIS — Z3A35 35 weeks gestation of pregnancy: Secondary | ICD-10-CM

## 2019-09-10 DIAGNOSIS — A6000 Herpesviral infection of urogenital system, unspecified: Secondary | ICD-10-CM | POA: Diagnosis present

## 2019-09-10 DIAGNOSIS — O42913 Preterm premature rupture of membranes, unspecified as to length of time between rupture and onset of labor, third trimester: Principal | ICD-10-CM | POA: Diagnosis present

## 2019-09-10 DIAGNOSIS — Z98891 History of uterine scar from previous surgery: Secondary | ICD-10-CM

## 2019-09-10 DIAGNOSIS — O4290 Premature rupture of membranes, unspecified as to length of time between rupture and onset of labor, unspecified weeks of gestation: Secondary | ICD-10-CM | POA: Diagnosis present

## 2019-09-10 LAB — URINALYSIS, ROUTINE W REFLEX MICROSCOPIC
Bilirubin Urine: NEGATIVE
Glucose, UA: NEGATIVE mg/dL
Hgb urine dipstick: NEGATIVE
Ketones, ur: NEGATIVE mg/dL
Leukocytes,Ua: NEGATIVE
Nitrite: NEGATIVE
Protein, ur: NEGATIVE mg/dL
Specific Gravity, Urine: 1.004 — ABNORMAL LOW (ref 1.005–1.030)
pH: 7 (ref 5.0–8.0)

## 2019-09-10 LAB — CBC
HCT: 38.1 % (ref 36.0–46.0)
Hemoglobin: 12.1 g/dL (ref 12.0–15.0)
MCH: 22.7 pg — ABNORMAL LOW (ref 26.0–34.0)
MCHC: 31.8 g/dL (ref 30.0–36.0)
MCV: 71.6 fL — ABNORMAL LOW (ref 80.0–100.0)
Platelets: 216 10*3/uL (ref 150–400)
RBC: 5.32 MIL/uL — ABNORMAL HIGH (ref 3.87–5.11)
RDW: 15.2 % (ref 11.5–15.5)
WBC: 8.6 10*3/uL (ref 4.0–10.5)
nRBC: 0 % (ref 0.0–0.2)

## 2019-09-10 LAB — ABO/RH: ABO/RH(D): A POS

## 2019-09-10 LAB — TYPE AND SCREEN
ABO/RH(D): A POS
Antibody Screen: NEGATIVE

## 2019-09-10 LAB — RESPIRATORY PANEL BY RT PCR (FLU A&B, COVID)
Influenza A by PCR: NEGATIVE
Influenza B by PCR: NEGATIVE
SARS Coronavirus 2 by RT PCR: NEGATIVE

## 2019-09-10 LAB — POCT FERN TEST: POCT Fern Test: POSITIVE

## 2019-09-10 LAB — RPR: RPR Ser Ql: NONREACTIVE

## 2019-09-10 LAB — GROUP B STREP BY PCR: Group B strep by PCR: NEGATIVE

## 2019-09-10 SURGERY — Surgical Case
Anesthesia: Spinal | Wound class: Clean Contaminated

## 2019-09-10 MED ORDER — FENTANYL CITRATE (PF) 100 MCG/2ML IJ SOLN: 25.0000 ug | INTRAMUSCULAR | Status: DC | PRN

## 2019-09-10 MED ORDER — NIFEDIPINE 10 MG PO CAPS
10.0000 mg | ORAL_CAPSULE | Freq: Four times a day (QID) | ORAL | Status: DC | PRN
  Administered 2019-09-10 (×2): 10 mg via ORAL
  Filled 2019-09-10 (×3): qty 1

## 2019-09-10 MED ORDER — LACTATED RINGERS IV SOLN: INTRAVENOUS | Status: DC

## 2019-09-10 MED ORDER — ONDANSETRON HCL 4 MG/2ML IJ SOLN
INTRAMUSCULAR | Status: DC | PRN
Start: 2019-09-10 — End: 2019-09-10
  Administered 2019-09-10: 4 mg via INTRAVENOUS

## 2019-09-10 MED ORDER — TETANUS-DIPHTH-ACELL PERTUSSIS 5-2.5-18.5 LF-MCG/0.5 IM SUSP: 0.5000 mL | Freq: Once | INTRAMUSCULAR | Status: DC

## 2019-09-10 MED ORDER — DIBUCAINE (PERIANAL) 1 % EX OINT: 1.0000 "application " | TOPICAL_OINTMENT | CUTANEOUS | Status: DC | PRN

## 2019-09-10 MED ORDER — SCOPOLAMINE 1 MG/3DAYS TD PT72
1.0000 | MEDICATED_PATCH | Freq: Once | TRANSDERMAL | Status: AC
  Administered 2019-09-10: 1.5 mg via TRANSDERMAL

## 2019-09-10 MED ORDER — NALBUPHINE HCL 10 MG/ML IJ SOLN: 5.0000 mg | INTRAMUSCULAR | Status: DC | PRN

## 2019-09-10 MED ORDER — SODIUM CHLORIDE 0.9 % IR SOLN
Status: DC | PRN
  Administered 2019-09-10: 1000 mL
  Administered 2019-09-10: 1

## 2019-09-10 MED ORDER — MENTHOL 3 MG MT LOZG: 1.0000 | LOZENGE | OROMUCOSAL | Status: DC | PRN

## 2019-09-10 MED ORDER — SIMETHICONE 80 MG PO CHEW
80.0000 mg | CHEWABLE_TABLET | Freq: Three times a day (TID) | ORAL | Status: DC
  Administered 2019-09-11 – 2019-09-13 (×7): 80 mg via ORAL
  Filled 2019-09-10 (×7): qty 1

## 2019-09-10 MED ORDER — LACTATED RINGERS IV SOLN
INTRAVENOUS | Status: DC | PRN
Start: 2019-09-10 — End: 2019-09-10

## 2019-09-10 MED ORDER — STERILE WATER FOR IRRIGATION IR SOLN
Status: DC | PRN
  Administered 2019-09-10: 1000 mL

## 2019-09-10 MED ORDER — ACETAMINOPHEN 325 MG PO TABS: 650.0000 mg | ORAL_TABLET | ORAL | Status: DC | PRN

## 2019-09-10 MED ORDER — KETOROLAC TROMETHAMINE 30 MG/ML IJ SOLN: 30.0000 mg | Freq: Four times a day (QID) | INTRAMUSCULAR | Status: AC | PRN

## 2019-09-10 MED ORDER — SOD CITRATE-CITRIC ACID 500-334 MG/5ML PO SOLN
ORAL | Status: AC
  Administered 2019-09-10: 30 mL
  Filled 2019-09-10: qty 30

## 2019-09-10 MED ORDER — NALBUPHINE HCL 10 MG/ML IJ SOLN: 5.0000 mg | Freq: Once | INTRAMUSCULAR | Status: DC | PRN

## 2019-09-10 MED ORDER — OXYTOCIN 40 UNITS IN NORMAL SALINE INFUSION - SIMPLE MED
INTRAVENOUS | Status: AC
  Filled 2019-09-10: qty 1000

## 2019-09-10 MED ORDER — FENTANYL CITRATE (PF) 100 MCG/2ML IJ SOLN
INTRAMUSCULAR | Status: DC | PRN
  Administered 2019-09-10: 15 ug via INTRATHECAL

## 2019-09-10 MED ORDER — ONDANSETRON HCL 4 MG/2ML IJ SOLN
INTRAMUSCULAR | Status: AC
  Filled 2019-09-10: qty 2

## 2019-09-10 MED ORDER — CEFAZOLIN SODIUM-DEXTROSE 2-3 GM-%(50ML) IV SOLR
INTRAVENOUS | Status: DC | PRN
  Administered 2019-09-10: 2 g via INTRAVENOUS

## 2019-09-10 MED ORDER — ONDANSETRON HCL 4 MG/2ML IJ SOLN: 4.0000 mg | Freq: Three times a day (TID) | INTRAMUSCULAR | Status: DC | PRN

## 2019-09-10 MED ORDER — SIMETHICONE 80 MG PO CHEW: 80.0000 mg | CHEWABLE_TABLET | ORAL | Status: DC | PRN

## 2019-09-10 MED ORDER — OXYTOCIN 40 UNITS IN NORMAL SALINE INFUSION - SIMPLE MED: 2.5000 [IU]/h | INTRAVENOUS | Status: AC

## 2019-09-10 MED ORDER — KETOROLAC TROMETHAMINE 30 MG/ML IJ SOLN
30.0000 mg | Freq: Four times a day (QID) | INTRAMUSCULAR | Status: AC
  Administered 2019-09-11 (×3): 30 mg via INTRAVENOUS
  Filled 2019-09-10 (×3): qty 1

## 2019-09-10 MED ORDER — DIPHENHYDRAMINE HCL 25 MG PO CAPS: 25.0000 mg | ORAL_CAPSULE | ORAL | Status: DC | PRN

## 2019-09-10 MED ORDER — ACETAMINOPHEN 325 MG PO TABS
650.0000 mg | ORAL_TABLET | ORAL | Status: DC | PRN
  Administered 2019-09-11 – 2019-09-13 (×4): 650 mg via ORAL
  Filled 2019-09-10 (×5): qty 2

## 2019-09-10 MED ORDER — PHENYLEPHRINE HCL-NACL 10-0.9 MG/250ML-% IV SOLN
INTRAVENOUS | Status: DC | PRN
  Administered 2019-09-10: 50 ug/min via INTRAVENOUS

## 2019-09-10 MED ORDER — SENNOSIDES-DOCUSATE SODIUM 8.6-50 MG PO TABS
2.0000 | ORAL_TABLET | ORAL | Status: DC
  Administered 2019-09-11 – 2019-09-13 (×3): 2 via ORAL
  Filled 2019-09-10 (×3): qty 2

## 2019-09-10 MED ORDER — PHENYLEPHRINE HCL (PRESSORS) 10 MG/ML IV SOLN
INTRAVENOUS | Status: DC | PRN
Start: 2019-09-10 — End: 2019-09-10
  Administered 2019-09-10 (×2): 80 ug via INTRAVENOUS
  Administered 2019-09-10: 40 ug via INTRAVENOUS

## 2019-09-10 MED ORDER — PHENYLEPHRINE 40 MCG/ML (10ML) SYRINGE FOR IV PUSH (FOR BLOOD PRESSURE SUPPORT)
PREFILLED_SYRINGE | INTRAVENOUS | Status: AC
  Filled 2019-09-10: qty 10

## 2019-09-10 MED ORDER — ACETAMINOPHEN 500 MG PO TABS
1000.0000 mg | ORAL_TABLET | Freq: Four times a day (QID) | ORAL | Status: AC
  Administered 2019-09-10 – 2019-09-11 (×3): 1000 mg via ORAL
  Filled 2019-09-10 (×3): qty 2

## 2019-09-10 MED ORDER — ZOLPIDEM TARTRATE 5 MG PO TABS: 5.0000 mg | ORAL_TABLET | Freq: Every evening | ORAL | Status: DC | PRN

## 2019-09-10 MED ORDER — CALCIUM CARBONATE ANTACID 500 MG PO CHEW: 2.0000 | CHEWABLE_TABLET | ORAL | Status: DC | PRN

## 2019-09-10 MED ORDER — BUPIVACAINE IN DEXTROSE 0.75-8.25 % IT SOLN
INTRATHECAL | Status: DC | PRN
Start: 2019-09-10 — End: 2019-09-10
  Administered 2019-09-10: 1.6 mL via INTRATHECAL

## 2019-09-10 MED ORDER — SODIUM CHLORIDE 0.9% FLUSH: 3.0000 mL | INTRAVENOUS | Status: DC | PRN

## 2019-09-10 MED ORDER — NALBUPHINE HCL 10 MG/ML IJ SOLN
5.0000 mg | INTRAMUSCULAR | Status: DC | PRN
  Administered 2019-09-10 – 2019-09-11 (×2): 5 mg via INTRAVENOUS
  Filled 2019-09-10 (×2): qty 1

## 2019-09-10 MED ORDER — IBUPROFEN 600 MG PO TABS
600.0000 mg | ORAL_TABLET | Freq: Four times a day (QID) | ORAL | Status: DC | PRN
  Administered 2019-09-11 – 2019-09-13 (×7): 600 mg via ORAL
  Filled 2019-09-10 (×7): qty 1

## 2019-09-10 MED ORDER — NALOXONE HCL 0.4 MG/ML IJ SOLN: 0.4000 mg | INTRAMUSCULAR | Status: DC | PRN

## 2019-09-10 MED ORDER — SCOPOLAMINE 1 MG/3DAYS TD PT72
MEDICATED_PATCH | TRANSDERMAL | Status: AC
  Filled 2019-09-10: qty 1

## 2019-09-10 MED ORDER — PRENATAL MULTIVITAMIN CH: 1.0000 | ORAL_TABLET | Freq: Every day | ORAL | Status: DC

## 2019-09-10 MED ORDER — MORPHINE SULFATE (PF) 0.5 MG/ML IJ SOLN
INTRAMUSCULAR | Status: AC
  Filled 2019-09-10: qty 10

## 2019-09-10 MED ORDER — FENTANYL CITRATE (PF) 100 MCG/2ML IJ SOLN
INTRAMUSCULAR | Status: AC
  Filled 2019-09-10: qty 2

## 2019-09-10 MED ORDER — OXYCODONE HCL 5 MG PO TABS
5.0000 mg | ORAL_TABLET | Freq: Four times a day (QID) | ORAL | Status: DC | PRN
  Administered 2019-09-12 (×2): 10 mg via ORAL
  Administered 2019-09-13: 5 mg via ORAL
  Filled 2019-09-10: qty 2
  Filled 2019-09-10: qty 1
  Filled 2019-09-10: qty 2

## 2019-09-10 MED ORDER — PRENATAL MULTIVITAMIN CH
1.0000 | ORAL_TABLET | Freq: Every day | ORAL | Status: DC
  Administered 2019-09-11 – 2019-09-13 (×3): 1 via ORAL
  Filled 2019-09-10 (×3): qty 1

## 2019-09-10 MED ORDER — PHENYLEPHRINE HCL-NACL 20-0.9 MG/250ML-% IV SOLN
INTRAVENOUS | Status: AC
  Filled 2019-09-10: qty 250

## 2019-09-10 MED ORDER — COCONUT OIL OIL
1.0000 "application " | TOPICAL_OIL | Status: DC | PRN
  Administered 2019-09-10: 1 via TOPICAL

## 2019-09-10 MED ORDER — DOCUSATE SODIUM 100 MG PO CAPS: 100.0000 mg | ORAL_CAPSULE | Freq: Every day | ORAL | Status: DC

## 2019-09-10 MED ORDER — BETAMETHASONE SOD PHOS & ACET 6 (3-3) MG/ML IJ SUSP
12.0000 mg | Freq: Once | INTRAMUSCULAR | Status: AC
  Administered 2019-09-10: 12 mg via INTRAMUSCULAR

## 2019-09-10 MED ORDER — DIPHENHYDRAMINE HCL 50 MG/ML IJ SOLN: 12.5000 mg | INTRAMUSCULAR | Status: DC | PRN

## 2019-09-10 MED ORDER — SIMETHICONE 80 MG PO CHEW
80.0000 mg | CHEWABLE_TABLET | ORAL | Status: DC
  Administered 2019-09-11 – 2019-09-13 (×3): 80 mg via ORAL
  Filled 2019-09-10 (×3): qty 1

## 2019-09-10 MED ORDER — KETOROLAC TROMETHAMINE 30 MG/ML IJ SOLN
INTRAMUSCULAR | Status: AC
  Filled 2019-09-10: qty 1

## 2019-09-10 MED ORDER — NALOXONE HCL 4 MG/10ML IJ SOLN
1.0000 ug/kg/h | INTRAVENOUS | Status: DC | PRN
  Filled 2019-09-10: qty 5

## 2019-09-10 MED ORDER — OXYTOCIN 40 UNITS IN NORMAL SALINE INFUSION - SIMPLE MED
INTRAVENOUS | Status: DC | PRN
  Administered 2019-09-10: 25 mL via INTRAVENOUS

## 2019-09-10 MED ORDER — DIPHENHYDRAMINE HCL 25 MG PO CAPS: 25.0000 mg | ORAL_CAPSULE | Freq: Four times a day (QID) | ORAL | Status: DC | PRN

## 2019-09-10 MED ORDER — CEFAZOLIN SODIUM-DEXTROSE 2-4 GM/100ML-% IV SOLN
2.0000 g | Freq: Once | INTRAVENOUS | Status: DC
  Filled 2019-09-10: qty 100

## 2019-09-10 MED ORDER — BETAMETHASONE SOD PHOS & ACET 6 (3-3) MG/ML IJ SUSP
12.0000 mg | Freq: Once | INTRAMUSCULAR | Status: AC
  Administered 2019-09-10: 12 mg via INTRAMUSCULAR
  Filled 2019-09-10: qty 5

## 2019-09-10 MED ORDER — KETOROLAC TROMETHAMINE 30 MG/ML IJ SOLN
30.0000 mg | Freq: Once | INTRAMUSCULAR | Status: AC | PRN
  Administered 2019-09-10: 30 mg via INTRAVENOUS

## 2019-09-10 MED ORDER — MORPHINE SULFATE (PF) 0.5 MG/ML IJ SOLN
INTRAMUSCULAR | Status: DC | PRN
  Administered 2019-09-10: 150 ug via INTRATHECAL

## 2019-09-10 MED ORDER — ACETAMINOPHEN 10 MG/ML IV SOLN: 1000.0000 mg | Freq: Once | INTRAVENOUS | Status: DC | PRN

## 2019-09-10 MED ORDER — WITCH HAZEL-GLYCERIN EX PADS: 1.0000 "application " | MEDICATED_PAD | CUTANEOUS | Status: DC | PRN

## 2019-09-10 SURGICAL SUPPLY — 37 items
BARRIER ADHS 3X4 INTERCEED (GAUZE/BANDAGES/DRESSINGS) ×3 IMPLANT
BENZOIN TINCTURE PRP APPL 2/3 (GAUZE/BANDAGES/DRESSINGS) ×3 IMPLANT
CHLORAPREP W/TINT 26ML (MISCELLANEOUS) ×3 IMPLANT
CLAMP CORD UMBIL (MISCELLANEOUS) IMPLANT
CLOSURE STERI STRIP 1/2 X4 (GAUZE/BANDAGES/DRESSINGS) ×3 IMPLANT
CLOTH BEACON ORANGE TIMEOUT ST (SAFETY) ×3 IMPLANT
DERMABOND ADVANCED (GAUZE/BANDAGES/DRESSINGS)
DERMABOND ADVANCED .7 DNX12 (GAUZE/BANDAGES/DRESSINGS) IMPLANT
DRSG OPSITE POSTOP 4X10 (GAUZE/BANDAGES/DRESSINGS) ×3 IMPLANT
ELECT REM PT RETURN 9FT ADLT (ELECTROSURGICAL) ×3
ELECTRODE REM PT RTRN 9FT ADLT (ELECTROSURGICAL) ×1 IMPLANT
EXTRACTOR VACUUM KIWI (MISCELLANEOUS) IMPLANT
GLOVE BIOGEL PI IND STRL 7.0 (GLOVE) ×3 IMPLANT
GLOVE BIOGEL PI INDICATOR 7.0 (GLOVE) ×6
GLOVE ECLIPSE 6.5 STRL STRAW (GLOVE) ×3 IMPLANT
GOWN STRL REUS W/TWL LRG LVL3 (GOWN DISPOSABLE) ×9 IMPLANT
KIT ABG SYR 3ML LUER SLIP (SYRINGE) IMPLANT
NEEDLE HYPO 25X5/8 SAFETYGLIDE (NEEDLE) IMPLANT
NS IRRIG 1000ML POUR BTL (IV SOLUTION) ×3 IMPLANT
PACK C SECTION WH (CUSTOM PROCEDURE TRAY) ×3 IMPLANT
PAD ABD 7.5X8 STRL (GAUZE/BANDAGES/DRESSINGS) ×3 IMPLANT
PAD OB MATERNITY 4.3X12.25 (PERSONAL CARE ITEMS) ×3 IMPLANT
PENCIL SMOKE EVAC W/HOLSTER (ELECTROSURGICAL) ×3 IMPLANT
RTRCTR C-SECT PINK 25CM LRG (MISCELLANEOUS) ×3 IMPLANT
STRIP CLOSURE SKIN 1/2X4 (GAUZE/BANDAGES/DRESSINGS) ×2 IMPLANT
SUT PLAIN 0 NONE (SUTURE) IMPLANT
SUT PLAIN 2 0 XLH (SUTURE) IMPLANT
SUT VIC AB 0 CT1 27 (SUTURE) ×4
SUT VIC AB 0 CT1 27XBRD ANBCTR (SUTURE) ×2 IMPLANT
SUT VIC AB 0 CTX 36 (SUTURE) ×6
SUT VIC AB 0 CTX36XBRD ANBCTRL (SUTURE) ×3 IMPLANT
SUT VIC AB 2-0 CT1 27 (SUTURE) ×2
SUT VIC AB 2-0 CT1 TAPERPNT 27 (SUTURE) ×1 IMPLANT
SUT VIC AB 4-0 KS 27 (SUTURE) ×3 IMPLANT
TOWEL OR 17X24 6PK STRL BLUE (TOWEL DISPOSABLE) ×3 IMPLANT
TRAY FOLEY W/BAG SLVR 14FR LF (SET/KITS/TRAYS/PACK) IMPLANT
WATER STERILE IRR 1000ML POUR (IV SOLUTION) ×3 IMPLANT

## 2019-09-10 NOTE — Transfer of Care (Signed)
Immediate Anesthesia Transfer of Care Note  Patient: Nancy Conley  Procedure(s) Performed: REPEAT CESAREAN SECTION - REQUEST RNFA (N/A )  Patient Location: PACU  Anesthesia Type:Spinal  Level of Consciousness: awake, alert , oriented and patient cooperative  Airway & Oxygen Therapy: Patient Spontanous Breathing  Post-op Assessment: Report given to RN and Post -op Vital signs reviewed and stable  Post vital signs: Reviewed and stable  Last Vitals:  Vitals Value Taken Time  BP    Temp    Pulse    Resp    SpO2      Last Pain:  Vitals:   09/10/19 1136  TempSrc: Oral  PainSc:       Patients Stated Pain Goal: 3 (09/10/19 0400)  Complications: No apparent anesthesia complications

## 2019-09-10 NOTE — Anesthesia Procedure Notes (Signed)
Spinal  Patient location during procedure: OR Start time: 09/10/2019 4:25 PM End time: 09/10/2019 4:35 PM Staffing Performed: anesthesiologist  Anesthesiologist: Elmer Picker, MD Preanesthetic Checklist Completed: patient identified, IV checked, risks and benefits discussed, surgical consent, monitors and equipment checked, pre-op evaluation and timeout performed Spinal Block Patient position: sitting Prep: DuraPrep and site prepped and draped Patient monitoring: cardiac monitor, continuous pulse ox and blood pressure Approach: midline Location: L3-4 Injection technique: single-shot Needle Needle type: Pencan  Needle gauge: 24 G Needle length: 9 cm Assessment Sensory level: T6 Additional Notes Functioning IV was confirmed and monitors were applied. Sterile prep and drape, including hand hygiene and sterile gloves were used. The patient was positioned and the spine was prepped. The skin was anesthetized with lidocaine.  Free flow of clear CSF was obtained prior to injecting local anesthetic into the CSF.  The spinal needle aspirated freely following injection.  The needle was carefully withdrawn.  The patient tolerated the procedure well.

## 2019-09-10 NOTE — Anesthesia Preprocedure Evaluation (Signed)
Anesthesia Evaluation  Patient identified by MRN, date of birth, ID band Patient awake    Reviewed: Allergy & Precautions, NPO status , Patient's Chart, lab work & pertinent test results  Airway Mallampati: II  TM Distance: >3 FB Neck ROM: Full    Dental no notable dental hx. (+) Teeth Intact, Dental Advisory Given   Pulmonary neg pulmonary ROS,    Pulmonary exam normal breath sounds clear to auscultation       Cardiovascular negative cardio ROS Normal cardiovascular exam Rhythm:Regular Rate:Normal     Neuro/Psych negative neurological ROS  negative psych ROS   GI/Hepatic negative GI ROS, Neg liver ROS,   Endo/Other  negative endocrine ROS  Renal/GU negative Renal ROS  negative genitourinary   Musculoskeletal negative musculoskeletal ROS (+)   Abdominal   Peds  Hematology negative hematology ROS (+)   Anesthesia Other Findings Repeat C/S at 35.[redacted] weeks gestation for PPROM  Reproductive/Obstetrics (+) Pregnancy                             Anesthesia Physical Anesthesia Plan  ASA: III  Anesthesia Plan: Spinal   Post-op Pain Management:    Induction:   PONV Risk Score and Plan: 2 and Treatment may vary due to age or medical condition  Airway Management Planned: Natural Airway  Additional Equipment:   Intra-op Plan:   Post-operative Plan:   Informed Consent: I have reviewed the patients History and Physical, chart, labs and discussed the procedure including the risks, benefits and alternatives for the proposed anesthesia with the patient or authorized representative who has indicated his/her understanding and acceptance.     Dental advisory given  Plan Discussed with: CRNA  Anesthesia Plan Comments:         Anesthesia Quick Evaluation

## 2019-09-10 NOTE — Plan of Care (Signed)
  Problem: Education: Goal: Knowledge of General Education information will improve Description: Including pain rating scale, medication(s)/side effects and non-pharmacologic comfort measures Outcome: Completed/Met

## 2019-09-10 NOTE — MAU Note (Signed)
Patient reports waking up to pee and felt like she couldn't hold her urine.  Saw some mucousy discharge in the toilet with a small amount of blood in it. Since then she feels involuntary fluid leak out whenever she stands up.  Denies CTX.  Planning repeat C/S.  + FM.

## 2019-09-10 NOTE — Op Note (Signed)
PreOp Diagnosis:  1) PPROM @ [redacted]w[redacted]d 2) Prior C-section, desires repeat PostOp Diagnosis: same Procedure: Repeat C-section Surgeon: Dr. Myna Hidalgo Assistant: Dt. Gerald Leitz Anesthesia: spinal Complications: none EBL: 405cc UOP: 200cc Fluids: 1800cc  Findings: Female infant from vertex presentation, normal uterus, tubes and ovaries bilaterally  PROCEDURE:  Informed consent was obtained from the patient with risks, benefits, complications, treatment options, and expected outcomes discussed with the patient.  The patient concurred with the proposed plan, giving informed consent with form signed.   The patient was taken to Operating Room, and identified with the procedure verified as C-Section Delivery with Time Out. With induction of anesthesia, the patient was prepped and draped in the usual sterile fashion. A Pfannenstiel incision was made and carried down through the subcutaneous tissue to the fascia. The fascia was incised in the midline and extended transversely. The superior aspect of the fascial incision was grasped with Kochers elevated and the underlying muscle dissected off. The inferior aspect of the facial incision was in similar fashion, grasped elevated and rectus muscles dissected off. The peritoneum was identified and entered. Peritoneal incision was extended longitudinally. Alexis retractor was placed.  The utero-vesical peritoneal reflection was identified and incised transversely with the [facility] scissors, the incision extended laterally, the bladder flap created digitally. A low transverse uterine incision was made and the infants head delivered atraumatically. After the umbilical cord was clamped and cut cord blood was obtained for evaluation.   The placenta was removed intact and appeared normal. The uterine outline, tubes and ovaries appeared normal. The uterine incision was closed with running locked sutures of 0 Vicryl and a second layer of the same stitch was used in an  imbricating fashion.  Excellent hemostasis was obtained.  The pericolic gutters were then cleared of all clots and debris. Interceed was placed.  Jon Gills was removed.  The peritoneum was closed in a running fashion using 2-0 vicryl. The fascia was then reapproximated with running sutures of 0 Vicryl. The subcutaneous tissue was reapproximated with 2-0 plain gut suture. The skin was closed with 4-0 vicryl in a subcuticular fashion.  Instrument, sponge, and needle counts were correct prior the abdominal closure and at the conclusion of the case. The patient was taken to recovery in stable condition.  Myna Hidalgo, DO [phone] (cell) [phone] (office)

## 2019-09-10 NOTE — H&P (Signed)
HPI: 39 y/o G2P1001 @ [redacted]w[redacted]d estimated gestational age (as dated by LMP c/w 20 week ultrasound) admitted for PPROM.   + Leaking of Fluid- starting around midnight,   One spot of blood-tinged mucus, no further bleeding.  Notes some mild cramping, but denies painful contractions, + Fetal Movement.  Prenatal care has been provided by Dr. Simona Huh  ROS: no HA, no epigastric pain, no visual changes.    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, asymptomatic 4) h/o CKC- cervical length normal  Prenatal Transfer Tool  Maternal Diabetes: No Genetic Screening: Normal Maternal Ultrasounds/Referrals: Normal Fetal Ultrasounds or other Referrals:  None Maternal Substance Abuse:  No Significant Maternal Medications:  None Significant Maternal Lab Results: GBS negative   PNL:  GBS negative, Rub Immune, Hep B neg, RPR NR, HIV neg, GC/C neg, glucola:142, 3hr within normal limits Hgb: 11.3 Blood type: A positive  Immunizations: Tdap: 07/19/2019   OBHx: 2016- primary C-section- 7#10oz, fetal intolerance to labor PMHx:  HSV Meds:  PNV, Valtrex Allergy:  No Known Allergies SurgHx: C-sectionx1 SocHx:   no Tobacco, no  EtOH, no Illicit Drugs  O: BP 798/92 (BP Location: Right Arm)   Pulse 78   Temp 98.5 F (36.9 C) (Oral)   Resp 18   Ht 5\' 4"  (1.626 m)   Wt 72.3 kg   SpO2 100%   BMI 27.37 kg/m  Gen. AAOx3, NAD CV.  RRR  Resp. CTAB, no wheeze or crackles. Abd. Gravid,  no tenderness,  no rigidity,  no guarding Extr.  no edema B/L , no calf tenderness, neg Homan's B/L  FHT: 145 baseline, moderate variability, no accels,  no decels Toco: q2-3 min SVE: deferred     Labs:  Results for orders placed or performed during the hospital encounter of 09/10/19 (from the past 24 hour(s))  Urinalysis, Routine w reflex microscopic     Status: Abnormal   Collection Time: 09/10/19  2:08 AM  Result  Value Ref Range   Color, Urine STRAW (A) YELLOW   APPearance CLEAR CLEAR   Specific Gravity, Urine 1.004 (L) 1.005 - 1.030   pH 7.0 5.0 - 8.0   Glucose, UA NEGATIVE NEGATIVE mg/dL   Hgb urine dipstick NEGATIVE NEGATIVE   Bilirubin Urine NEGATIVE NEGATIVE   Ketones, ur NEGATIVE NEGATIVE mg/dL   Protein, ur NEGATIVE NEGATIVE mg/dL   Nitrite NEGATIVE NEGATIVE   Leukocytes,Ua NEGATIVE NEGATIVE  Fern Test     Status: None   Collection Time: 09/10/19  2:21 AM  Result Value Ref Range   POCT Fern Test Positive = ruptured amniotic membanes   Type and screen [place]     Status: None   Collection Time: 09/10/19  2:43 AM  Result Value Ref Range   ABO/RH(D) A POS    Antibody Screen NEG    Sample Expiration      09/13/2019,2359 Performed at [facility], [address], [place], [place] [address]   ABO/Rh     Status: None (Preliminary result)   Collection Time: 09/10/19  2:43 AM  Result Value Ref Range   ABO/RH(D)      A POS Performed at [facility] [address], [place], [place] [address]   Respiratory Panel by RT PCR (Flu A&B, Covid) - Nasopharyngeal Swab     Status: None   Collection Time: 09/10/19  2:51 AM   Specimen:  Nasopharyngeal Swab  Result Value Ref Range   SARS Coronavirus 2 by RT PCR NEGATIVE NEGATIVE   Influenza A by PCR NEGATIVE NEGATIVE   Influenza B by PCR NEGATIVE NEGATIVE  CBC     Status: Abnormal   Collection Time: 09/10/19  3:13 AM  Result Value Ref Range   WBC 8.6 4.0 - 10.5 K/uL   RBC 5.32 (H) 3.87 - 5.11 MIL/uL   Hemoglobin 12.1 12.0 - 15.0 g/dL   HCT 26.3 78.5 - 88.5 %   MCV 71.6 (L) 80.0 - 100.0 fL   MCH 22.7 (L) 26.0 - 34.0 pg   MCHC 31.8 30.0 - 36.0 g/dL   RDW 02.7 74.1 - 28.7 %   Platelets 216 150 - 400 K/uL   nRBC 0.0 0.0 - 0.2 %  Group B strep by PCR     Status: None   Collection Time: 09/10/19  3:30 AM   Specimen: Vaginal/Rectal; Genital  Result Value Ref Range   Group B strep by PCR NEGATIVE NEGATIVE      A/P:  39 y.o. G2P1001 @ [redacted]w[redacted]d EGA who presents for PPROM -FWB:  NICHD Cat I FHTs -PPROM: Due to gestational age and prior C-section- pt desires repeat C-section.  Risk benefits and alternatives of cesarean section were discussed with the patient including but not limited to infection, bleeding, damage to bowel , bladder and baby with the need for further surgery. Pt voiced understanding and desires to proceed.  -Betamethasone given -IV fluids -NPO -SCDs to bedside -Ancef 2g IV to OR  Myna Hidalgo, DO [phone] (cell) [phone] (office)

## 2019-09-10 NOTE — Consult Note (Addendum)
Neonatology Note:   Attendance at C-section:    I was asked by Dr. Ozan to attend this repeat C/S at 35 6/[redacted] weeks EGA due to PROM. The mother is a G2P1, GBS neg with good prenatal care complicated by HSV2 on valtrex and AMA, low risk with normal anatomy scan. BTMZ complete today.  ROM 17h 02m prior to delivery, fluid clear. Infant vigorous with good spontaneous cry and tone. Needed minimal bulb suctioning. +60 sec DCC.  Ap 8/9. Lungs clear to ausc in DR.  SAO2 checked and in low 90s at 4-5 minutes of life.  Father introduced and parents updated including brief info on care of late preterm baby centered around ensuring appropriate breathing, temperature control and feedings.  To CN to care of Pediatrician.  Please contact Neonatology if concerns.    Gino Garrabrant C. Siarah Deleo, MD 

## 2019-09-10 NOTE — Anesthesia Postprocedure Evaluation (Signed)
Anesthesia Post Note  Patient: Nancy Conley  Procedure(s) Performed: REPEAT CESAREAN SECTION - REQUEST RNFA (N/A )     Patient location during evaluation: PACU Anesthesia Type: Spinal Level of consciousness: awake and alert and oriented Pain management: pain level controlled Vital Signs Assessment: post-procedure vital signs reviewed and stable Respiratory status: spontaneous breathing, nonlabored ventilation and respiratory function stable Cardiovascular status: blood pressure returned to baseline Postop Assessment: no apparent nausea or vomiting, spinal receding, no headache and no backache Anesthetic complications: no    Last Vitals:  Vitals:   09/10/19 1830 09/10/19 1845  BP: 122/82 130/70  Pulse: 89 88  Resp: 15 16  Temp:    SpO2: 100% 100%    Last Pain:  Vitals:   09/10/19 1845  TempSrc:   PainSc: 0-No pain   Pain Goal: Patients Stated Pain Goal: 3 (09/10/19 0400)  LLE Motor Response: Purposeful movement (09/10/19 1830) LLE Sensation: Tingling (09/10/19 1830) RLE Motor Response: Purposeful movement (09/10/19 1830) RLE Sensation: Tingling (09/10/19 1830)     Epidural/Spinal Function Cutaneous sensation: Tingles (09/10/19 1830), Patient able to flex knees: Yes (09/10/19 1830), Patient able to lift hips off bed: No (09/10/19 1830), Back pain beyond tenderness at insertion site: No (09/10/19 1830), Progressively worsening motor and/or sensory loss: No (09/10/19 1830), Bowel and/or bladder incontinence post epidural: No (09/10/19 1830)  Kaylyn Layer

## 2019-09-11 ENCOUNTER — Encounter: Payer: Self-pay | Admitting: *Deleted

## 2019-09-11 LAB — CBC
HCT: 32.3 % — ABNORMAL LOW (ref 36.0–46.0)
Hemoglobin: 10.4 g/dL — ABNORMAL LOW (ref 12.0–15.0)
MCH: 23 pg — ABNORMAL LOW (ref 26.0–34.0)
MCHC: 32.2 g/dL (ref 30.0–36.0)
MCV: 71.3 fL — ABNORMAL LOW (ref 80.0–100.0)
Platelets: 198 10*3/uL (ref 150–400)
RBC: 4.53 MIL/uL (ref 3.87–5.11)
RDW: 14.9 % (ref 11.5–15.5)
WBC: 17.3 10*3/uL — ABNORMAL HIGH (ref 4.0–10.5)
nRBC: 0 % (ref 0.0–0.2)

## 2019-09-11 MED ORDER — OXYCODONE HCL 5 MG PO TABS: 5.0000 mg | ORAL_TABLET | Freq: Four times a day (QID) | ORAL | 0 refills | Status: AC | PRN

## 2019-09-11 MED ORDER — IBUPROFEN 600 MG PO TABS: 600.0000 mg | ORAL_TABLET | Freq: Four times a day (QID) | ORAL | 0 refills | Status: AC | PRN

## 2019-09-11 NOTE — Lactation Note (Signed)
This note was copied from a baby's chart. Lactation Consultation Note  Patient Name: Nancy Conley [id number] Date: 09/11/2019 Reason for consult: Initial assessment;Late-preterm 34-36.6wks P2, 12 hour LPTI. Mom's hx: C/S, HSV-Valtrex-L2 safe with breastfeeding. Tools given: DEBP to help establish milk supply. Mom's feeding choice is breastfeeding and due to infant being LPTI, mom  decided to supplement infant with donor breast milk. Per mom, infant had one stool and two voids since birth. Mom feels infant is not having a deep latch, LC entered room mom had infant latch in cradle hold without pillow support. Mom willing to break latch, LC notice mom has abrasions on both breast, mom was given coconut oil by RN. Per mom, she breastfed her 13 year old daughter for one year.  Mom latched infant on right breast with pillow support using the football hold position, LC ask mom to hand express a small amount of colostrum out of breast prior to latching infant. Mom brought infant to breast, chin first with nose and chin touching breast, nostrils free, LC observed swallows and infant was still breastfeeding after 17 minutes when LC left room. Per mom, she could tell a difference with latch and that infant latch is deep this time. LC discussed LPTI feeding policy, mom knows to breastfeed infant according to cues, 8 to 12+ times within 24 hours, and not exceed 3 hours without feeding infant, mom with breastfeed infant STS, not go past 3 hours without breastfeeding infant and not exceed 30 minutes with feeding. Mom will supplement infant after each feeding with EBM first and then donor breast milk according infant age/ hours of life. LC alert RN of mom's choice to start supplementing infant with donor breast milk after latching infant at breast with each feeding. Mom knows to call RN or LC if she has any questions, concerns or needs assistance with latching infant at breast. Reviewed Baby & Me book's  Breastfeeding Basics.  Mom made aware of O/P services, breastfeeding support groups, community resources, and our phone # for post-discharge questions.     Maternal Data Formula Feeding for Exclusion: No Has patient been taught Hand Expression?: Yes Does the patient have breastfeeding experience prior to this delivery?: Yes  Feeding Feeding Type: Breast Fed  LATCH Score Latch: Grasps breast easily, tongue down, lips flanged, rhythmical sucking.  Audible Swallowing: A few with stimulation  Type of Nipple: Everted at rest and after stimulation  Comfort (Breast/Nipple): Soft / non-tender  Hold (Positioning): Assistance needed to correctly position infant at breast and maintain latch.  LATCH Score: 8  Interventions Interventions: Breast feeding basics reviewed;Breast compression;Adjust position;Assisted with latch;Skin to skin;Support pillows;Breast massage;Position options;Hand express;Expressed milk;DEBP;Coconut oil  Lactation Tools Discussed/Used WIC Program: No Pump Review: Setup, frequency, and cleaning;Milk Storage Initiated by:: Danelle Earthly, IBCLC Date initiated:: 09/11/19   Consult Status Consult Status: Follow-up Date: 09/11/19 Follow-up type: In-patient    Danelle Earthly 09/11/2019, 5:39 AM

## 2019-09-11 NOTE — Progress Notes (Signed)
Subjective: Postop Day 1: Cesarean Delivery No complaints.  Pain controlled.  Lochia normal.  Breast feeding yes.  Not passing gas.  Foley just removed and is going to the bathroom.  Objective: Temp:  [97.8 F (36.6 C)-98.5 F (36.9 C)] 98.1 F (36.7 C) (05/05 0827) Pulse Rate:  [69-94] 72 (05/05 0827) Resp:  [15-20] 18 (05/05 0827) BP: (102-134)/(68-82) 110/78 (05/05 0827) SpO2:  [96 %-100 %] 100 % (05/05 0430)  Physical Exam: Gen: NAD Abd:  Distended, tympanic to percussion.  Lochia: Not visualized Uterine Fundus: firm, appropriately tender Incision: Honeycomb moderately soiled on right quarter DVT Evaluation: + Edema present   Recent Labs    09/10/19 0313 09/11/19 0535  HGB 12.1 10.4*  HCT 38.1 32.3*    Assessment/Plan: Status post C-section @ 35 6/7 weeks-doing well postoperatively. Routine post op care. Encouraged ambulation in halls TID. Lactation support. Pt desires discharge on POD#2.  Newborn doing well in room with parents.    Geryl Rankins 09/11/2019, 8:43 AM

## 2019-09-12 LAB — SURGICAL PATHOLOGY

## 2019-09-12 NOTE — Progress Notes (Signed)
Subjective: Postop Day 2: Cesarean Delivery No complaints.  Pain controlled.  Lochia normal.  Breast feeding yes.  Tolerating gen diet.  +flatus, no BM.  Voiding freely.  No acute complaints  Objective: Temp:  [97.8 F (36.6 C)-98.7 F (37.1 C)] 97.8 F (36.6 C) (05/06 0535) Pulse Rate:  [83-84] 83 (05/06 0535) Resp:  [18-20] 20 (05/06 0535) BP: (100-116)/(67-70) 100/67 (05/06 0535) SpO2:  [98 %-99 %] 98 % (05/06 0535)  Physical Exam: Gen: NAD CV:RRR Lungs: CTAB Abd: soft, appropriately tender, no rebound, no guarding Lochia: Not visualized Uterine Fundus: firm, appropriately tender Incision: Honeycomb moderately soiled on right quarter- unchanged DVT Evaluation: minimal edema   Recent Labs    09/10/19 0313 09/11/19 0535  HGB 12.1 10.4*  HCT 38.1 32.3*    Assessment/Plan: Status post C-section @ 35 6/7 weeks-doing well postoperatively.POD#2 Routine post op care. Encouraged ambulation in halls TID. Lactation support. Newborn doing well in room with parents.  Continue with routine postpartum care- possible discharge pending baby's status    TERYN BOEREMA 09/12/2019, 3:27 PM

## 2019-09-12 NOTE — Lactation Note (Signed)
This note was copied from a baby's chart. Lactation Consultation Note  Patient Name: Nancy Conley [id number]'N Date: 09/12/2019 Reason for consult: Follow-up assessment;Late-preterm 34-36.6wks;Infant < 6lbs;Infant weight loss   LC in to see P2 Mom of LPTI at 37 hrs old.  Baby at 6% weight loss.  Baby has been breastfeeding and received 5-7 ml of donor milk and EBM 3 times in last 24 hrs.  Mom states she double pumped once so far.   Talked about LPTI newborn behavior and the importance of regular supplementation in the early weeks until baby is more mature and has gained some weight.  Mom is very eager to feed baby as baby was fussy and wanting to feed (cluster) all night.  Assisted Mom to pace bottle feed using purple extra flow nipple.  Baby easily consumed 20 ml donor milk.    Disassembled pump parts, washed, rinsed and placed in separate bin to air dry.  Reattached pump parts at bedside for Mom to start pumping.   Encouraged Mom to increase frequency of her pumping.  She has a Medela DEBP at home.  RN updated on importance of increased volume at feeds.    Plan- 1- Keep baby STS as much as possible 2- Offer breast with cues, or awaken at 3 hrs, limit to 30 mins 3- Supplement with 10-20 ml donor milk/EBM increasing volume to 20-30 ml after 48 hrs old.  Pace bottle feeding 4- Pump both breasts 15-30 mins, adding breast massage and hand expression 5- ask for help prn.   Interventions Interventions: Breast feeding basics reviewed;Skin to skin;Breast massage;Hand express;DEBP;Expressed milk  Lactation Tools Discussed/Used Tools: Pump;Bottle Breast pump type: Double-Electric Breast Pump   Consult Status Consult Status: Follow-up Date: 09/13/19 Follow-up type: In-patient    Judee Clara 09/12/2019, 9:02 AM

## 2019-09-12 NOTE — Discharge Summary (Addendum)
Postpartum Discharge Summary     Patient Name: Nancy Conley DOB: 1980-06-24 MRN: [id number]  Date of admission: 09/10/2019 Delivering Provider: Janyth Pupa   Date of discharge: 09/14/2019  Admitting diagnosis: Premature rupture of membranes [O42.90] Intrauterine pregnancy: [redacted]w[redacted]d    Secondary diagnosis:  Active Problems:   Premature rupture of membranes  Additional problems: history of prior C-section     Discharge diagnosis: Term Pregnancy Delivered                                                                                                Post partum procedures: none  Augmentation:  n/a  Complications: None  Hospital course:  Sceduled C/S   39y.o. yo G2P1102 at 39w6das admitted to the hospital 09/10/2019 for scheduled cesarean section with the following indication: PPROM and history of prior C-section, desire for repeat .  Membrane Rupture Time/Date: 12:00 AM ,09/10/2019   Patient delivered a Viable infant.09/10/2019  Details of operation can be found in separate operative note.  Pateint had an uncomplicated postpartum course.  She is ambulating, tolerating a regular diet, passing flatus, and urinating well. Patient is discharged home in stable condition on  09/12/19        Delivery time: 5:02 PM    Magnesium Sulfate received: No BMZ received: Yes Rhophylac:N/A MMR:N/A Transfusion:No  Physical exam  Vitals:   09/11/19 0827 09/11/19 1309 09/11/19 2219 09/12/19 0535  BP: 110/78 (!) 100/59 116/70 100/67  Pulse: 72 70 84 83  Resp: 18 18 18 20   Temp: 98.1 F (36.7 C) 98.2 F (36.8 C) 98.7 F (37.1 C) 97.8 F (36.6 C)  TempSrc: Oral Oral Oral Oral  SpO2:  97% 99% 98%  Weight:      Height:       General: alert, cooperative and no distress Lochia: appropriate Uterine Fundus: firm Incision: Dressing is clean, dry, and intact DVT Evaluation: No evidence of DVT seen on physical exam. Labs: Lab Results  Component Value Date   WBC 17.3 (H) 09/11/2019   HGB 10.4 (L) 09/11/2019   HCT 32.3 (L) 09/11/2019   MCV 71.3 (L) 09/11/2019   PLT 198 09/11/2019   No flowsheet data found. Edinburgh Score: Edinburgh Postnatal Depression Scale Screening Tool 09/11/2019  I have been able to laugh and see the funny side of things. 0  I have looked forward with enjoyment to things. 0  I have blamed myself unnecessarily when things went wrong. 1  I have been anxious or worried for no good reason. 0  I have felt scared or panicky for no good reason. 1  Things have been getting on top of me. 0  I have been so unhappy that I have had difficulty sleeping. 0  I have felt sad or miserable. 0  I have been so unhappy that I have been crying. 0  The thought of harming myself has occurred to me. 0  Edinburgh Postnatal Depression Scale Total 2    Discharge instruction: per After Visit Summary and "Baby and Me Booklet".  After visit meds:  Allergies as of 09/12/2019  No Known Allergies      Medication List     STOP taking these medications    HYDROcodone-acetaminophen 5-325 MG tablet Commonly known as: Norco   naproxen 500 MG tablet Commonly known as: NAPROSYN       TAKE these medications    calcium carbonate 500 MG chewable tablet Commonly known as: TUMS - dosed in mg elemental calcium Chew 1 tablet by mouth daily as needed for indigestion or heartburn.   cholecalciferol 25 MCG (1000 UNIT) tablet Commonly known as: VITAMIN D3 Take 1,000 Units by mouth daily.   ibuprofen 600 MG tablet Commonly known as: ADVIL Take 1 tablet (600 mg total) by mouth every 6 (six) hours as needed for mild pain or moderate pain.   oxyCODONE 5 MG immediate release tablet Commonly known as: Oxy IR/ROXICODONE Take 1 tablet (5 mg total) by mouth every 6 (six) hours as needed for severe pain or breakthrough pain.   prenatal multivitamin Tabs tablet Take 1 tablet by mouth daily at 12 noon.               Discharge Care Instructions  (From admission,  onward)           Start     Ordered   09/11/19 0000  Discharge wound care:    Comments: Remove honeycomb dressing in the 5th day after surgery.   09/11/19 2203            Diet: routine diet  Activity: Advance as tolerated. Pelvic rest for 6 weeks.   Outpatient follow up:2 weeks Follow up Appt:No future appointments. Follow up Visit: Follow-up Information     Thurnell Lose, MD. Schedule an appointment as soon as possible for a visit in 2 week(s).   Specialty: Obstetrics and Gynecology Why: Post op check up Contact information: [address] [place] [address] [phone]             Newborn Data: Live born female  Birth Weight: 5 lb 1.5 oz (2310 g) APGAR: 8, 9  Newborn Delivery   Birth date/time: 09/10/2019 17:02:00 Delivery type: C-Section, Low Transverse Trial of labor: No C-section categorization: Repeat      Baby Feeding: Breast Disposition:home with mother   09/12/2019 Nancy Genta, DO

## 2019-09-13 MED ORDER — SERTRALINE HCL 50 MG PO TABS: 50.0000 mg | ORAL_TABLET | Freq: Every day | ORAL | 7 refills | Status: AC

## 2019-09-13 MED ORDER — SERTRALINE HCL 50 MG PO TABS
50.0000 mg | ORAL_TABLET | Freq: Every day | ORAL | Status: DC
  Administered 2019-09-13: 50 mg via ORAL
  Filled 2019-09-13: qty 1

## 2019-09-13 NOTE — Lactation Note (Signed)
This note was copied from a baby's chart. Lactation Consultation Note  Patient Name: Nancy Conley YIRSW'N Date: 09/13/2019 Reason for consult: Follow-up assessment;Infant < 6lbs;Late-preterm 34-36.6wks Baby is 65 hours old/5% weight loss.  Mom is currently pumping and bottle feeding.  She is pumping every 3 hours and recently pumped 30 mls from each breast.  Breasts are comfortable and mom massaging firm areas.  Discussed making an outpatient appointment in a week for feeding assist and assessment.  Mom has a Medela DEBP at home.  Stressed importance of pumping to establish and maintain a good milk supply.  No questions or concerns.   Maternal Data    Feeding    LATCH Score                   Interventions    Lactation Tools Discussed/Used     Consult Status Consult Status: Complete Follow-up type: Call as needed    Huston Foley 09/13/2019, 10:29 AM

## 2019-10-04 ENCOUNTER — Inpatient Hospital Stay (HOSPITAL_COMMUNITY): Admit: 2019-10-04 | Payer: Self-pay

## 2019-10-04 ENCOUNTER — Inpatient Hospital Stay (HOSPITAL_COMMUNITY): Admit: 2019-10-04 | Payer: 59 | Admitting: Obstetrics and Gynecology

## 2021-11-19 ENCOUNTER — Other Ambulatory Visit: Payer: Self-pay | Admitting: Internal Medicine

## 2021-11-19 DIAGNOSIS — N644 Mastodynia: Secondary | ICD-10-CM

## 2021-12-10 ENCOUNTER — Ambulatory Visit
Admission: RE | Admit: 2021-12-10 | Discharge: 2021-12-10 | Disposition: A | Discharge status: Home / Self Care | Payer: 59 | Source: Ambulatory Visit | Attending: Internal Medicine | Admitting: Internal Medicine

## 2021-12-10 ENCOUNTER — Other Ambulatory Visit: Payer: 59

## 2021-12-10 ENCOUNTER — Ambulatory Visit: Payer: 59

## 2021-12-10 DIAGNOSIS — N644 Mastodynia: Secondary | ICD-10-CM

## 2022-06-01 ENCOUNTER — Encounter: Payer: Self-pay | Admitting: Internal Medicine

## 2022-06-01 ENCOUNTER — Other Ambulatory Visit: Payer: Self-pay | Admitting: Internal Medicine

## 2022-06-01 DIAGNOSIS — N6452 Nipple discharge: Secondary | ICD-10-CM

## 2022-06-13 ENCOUNTER — Ambulatory Visit: Payer: 59

## 2022-06-13 ENCOUNTER — Ambulatory Visit
Admission: RE | Admit: 2022-06-13 | Discharge: 2022-06-13 | Disposition: A | Discharge status: Home / Self Care | Payer: 59 | Source: Ambulatory Visit | Attending: Internal Medicine | Admitting: Internal Medicine

## 2022-06-13 DIAGNOSIS — N6452 Nipple discharge: Secondary | ICD-10-CM

## 2023-06-16 ENCOUNTER — Other Ambulatory Visit: Payer: Self-pay | Admitting: Internal Medicine

## 2023-06-16 DIAGNOSIS — Z1231 Encounter for screening mammogram for malignant neoplasm of breast: Secondary | ICD-10-CM

## 2023-06-23 ENCOUNTER — Ambulatory Visit: Payer: 59

## 2023-07-05 ENCOUNTER — Ambulatory Visit
Admission: RE | Admit: 2023-07-05 | Discharge: 2023-07-05 | Disposition: A | Discharge status: Home / Self Care | Payer: 59 | Source: Ambulatory Visit | Attending: Internal Medicine

## 2023-07-05 DIAGNOSIS — Z1231 Encounter for screening mammogram for malignant neoplasm of breast: Secondary | ICD-10-CM

## 2023-07-12 ENCOUNTER — Other Ambulatory Visit (HOSPITAL_COMMUNITY)
Admission: RE | Admit: 2023-07-12 | Discharge: 2023-07-12 | Disposition: A | Discharge status: Home / Self Care | Source: Ambulatory Visit | Attending: Nurse Practitioner | Admitting: Nurse Practitioner

## 2023-07-12 ENCOUNTER — Other Ambulatory Visit: Payer: Self-pay | Admitting: Nurse Practitioner

## 2023-07-12 DIAGNOSIS — Z124 Encounter for screening for malignant neoplasm of cervix: Secondary | ICD-10-CM | POA: Diagnosis present

## 2023-07-14 LAB — CYTOLOGY - PAP
Comment: NEGATIVE
Diagnosis: NEGATIVE
High risk HPV: NEGATIVE

## 2024-05-21 ENCOUNTER — Other Ambulatory Visit: Payer: Self-pay | Admitting: Internal Medicine

## 2024-05-21 DIAGNOSIS — Z1231 Encounter for screening mammogram for malignant neoplasm of breast: Secondary | ICD-10-CM

## 2024-07-05 ENCOUNTER — Ambulatory Visit
